# Patient Record
Sex: Female | Born: 1970 | Race: Black or African American | Hispanic: No | Marital: Single | Smoking: Never smoker
Health system: Southern US, Community
[De-identification: ages and names within clinical notes are randomized; demographics above are authoritative.]

## PROBLEM LIST (undated history)

## (undated) DIAGNOSIS — R112 Nausea with vomiting, unspecified: Secondary | ICD-10-CM

## (undated) DIAGNOSIS — T8859XA Other complications of anesthesia, initial encounter: Secondary | ICD-10-CM

## (undated) DIAGNOSIS — D509 Iron deficiency anemia, unspecified: Secondary | ICD-10-CM

## (undated) DIAGNOSIS — D649 Anemia, unspecified: Secondary | ICD-10-CM

## (undated) HISTORY — DX: Anemia, unspecified: D64.9

## (undated) HISTORY — DX: Iron deficiency anemia, unspecified: D50.9

## (undated) HISTORY — PX: SKIN BIOPSY: SHX1

---

## 1997-12-27 ENCOUNTER — Emergency Department (HOSPITAL_COMMUNITY): Admission: EM | Admit: 1997-12-27 | Discharge: 1997-12-27 | Payer: Self-pay | Admitting: Emergency Medicine

## 1997-12-27 ENCOUNTER — Encounter: Payer: Self-pay | Admitting: Emergency Medicine

## 2000-08-04 ENCOUNTER — Encounter: Admission: RE | Admit: 2000-08-04 | Discharge: 2000-10-25 | Payer: Self-pay | Admitting: Internal Medicine

## 2000-11-20 ENCOUNTER — Emergency Department (HOSPITAL_COMMUNITY): Admission: EM | Admit: 2000-11-20 | Discharge: 2000-11-20 | Payer: Self-pay

## 2003-12-10 ENCOUNTER — Ambulatory Visit (HOSPITAL_COMMUNITY): Admission: RE | Admit: 2003-12-10 | Discharge: 2003-12-10 | Payer: Self-pay | Admitting: *Deleted

## 2004-01-22 ENCOUNTER — Encounter (INDEPENDENT_AMBULATORY_CARE_PROVIDER_SITE_OTHER): Payer: Self-pay | Admitting: Specialist

## 2004-01-22 ENCOUNTER — Ambulatory Visit (HOSPITAL_COMMUNITY): Admission: RE | Admit: 2004-01-22 | Discharge: 2004-01-22 | Payer: Self-pay | Admitting: *Deleted

## 2004-09-02 ENCOUNTER — Other Ambulatory Visit: Admission: RE | Admit: 2004-09-02 | Discharge: 2004-09-02 | Payer: Self-pay | Admitting: Family Medicine

## 2008-05-22 ENCOUNTER — Other Ambulatory Visit: Admission: RE | Admit: 2008-05-22 | Discharge: 2008-05-22 | Payer: Self-pay | Admitting: Family Medicine

## 2015-10-21 ENCOUNTER — Encounter: Payer: Self-pay | Admitting: Hematology and Oncology

## 2015-10-21 ENCOUNTER — Telehealth: Payer: Self-pay | Admitting: Hematology and Oncology

## 2015-10-21 NOTE — Telephone Encounter (Signed)
Appointment scheduled with Alvy Bimler on 8/11 at 11:15am. Patient preferred Friday appointments. I asked the patient if she would like to be seen at the [facility] location, patient states that she's ok with the [facility] location. Demographics verified. Letter to referring and patient.

## 2015-11-06 ENCOUNTER — Ambulatory Visit: Payer: Self-pay | Admitting: Hematology and Oncology

## 2015-11-13 ENCOUNTER — Encounter: Payer: Self-pay | Admitting: Hematology and Oncology

## 2015-11-13 ENCOUNTER — Telehealth: Payer: Self-pay | Admitting: Hematology and Oncology

## 2015-11-13 ENCOUNTER — Ambulatory Visit (HOSPITAL_BASED_OUTPATIENT_CLINIC_OR_DEPARTMENT_OTHER): Payer: BC Managed Care – PPO

## 2015-11-13 ENCOUNTER — Ambulatory Visit (HOSPITAL_BASED_OUTPATIENT_CLINIC_OR_DEPARTMENT_OTHER): Payer: BC Managed Care – PPO | Admitting: Hematology and Oncology

## 2015-11-13 ENCOUNTER — Other Ambulatory Visit: Payer: Self-pay | Admitting: Hematology and Oncology

## 2015-11-13 ENCOUNTER — Ambulatory Visit (HOSPITAL_COMMUNITY)
Admission: RE | Admit: 2015-11-13 | Discharge: 2015-11-13 | Disposition: A | Discharge status: Home / Self Care | Payer: BC Managed Care – PPO | Source: Ambulatory Visit | Attending: Hematology and Oncology | Admitting: Hematology and Oncology

## 2015-11-13 DIAGNOSIS — D509 Iron deficiency anemia, unspecified: Secondary | ICD-10-CM

## 2015-11-13 HISTORY — DX: Iron deficiency anemia, unspecified: D50.9

## 2015-11-13 LAB — CBC & DIFF AND RETIC
BASO%: 1.8 % (ref 0.0–2.0)
BASOS ABS: 0.1 10*3/uL (ref 0.0–0.1)
EOS ABS: 0.2 10*3/uL (ref 0.0–0.5)
EOS%: 3 % (ref 0.0–7.0)
HEMATOCRIT: 24.1 % — AB (ref 34.8–46.6)
HGB: 7.3 g/dL — ABNORMAL LOW (ref 11.6–15.9)
IMMATURE RETIC FRACT: 24.4 % — AB (ref 1.60–10.00)
LYMPH#: 1.3 10*3/uL (ref 0.9–3.3)
LYMPH%: 19.9 % (ref 14.0–49.7)
MCH: 20.6 pg — ABNORMAL LOW (ref 25.1–34.0)
MCHC: 30.3 g/dL — AB (ref 31.5–36.0)
MCV: 68.1 fL — ABNORMAL LOW (ref 79.5–101.0)
MONO#: 0.5 10*3/uL (ref 0.1–0.9)
MONO%: 7 % (ref 0.0–14.0)
NEUT#: 4.5 10*3/uL (ref 1.5–6.5)
NEUT%: 68.3 % (ref 38.4–76.8)
Platelets: 330 10*3/uL (ref 145–400)
RBC: 3.54 10*6/uL — AB (ref 3.70–5.45)
RDW: 18.5 % — ABNORMAL HIGH (ref 11.2–14.5)
RETIC %: 1.34 % (ref 0.70–2.10)
RETIC CT ABS: 47.44 10*3/uL (ref 33.70–90.70)
WBC: 6.6 10*3/uL (ref 3.9–10.3)
nRBC: 0 % (ref 0–0)

## 2015-11-13 LAB — TECHNOLOGIST REVIEW

## 2015-11-13 LAB — PREPARE RBC (CROSSMATCH)

## 2015-11-13 LAB — ABO/RH: ABO/RH(D): O POS

## 2015-11-13 LAB — FERRITIN: FERRITIN: 4 ng/mL — AB (ref 9–269)

## 2015-11-13 MED ORDER — ACETAMINOPHEN 325 MG PO TABS
650.0000 mg | ORAL_TABLET | Freq: Once | ORAL | Status: AC
  Administered 2015-11-13: 650 mg via ORAL

## 2015-11-13 MED ORDER — ACETAMINOPHEN 325 MG PO TABS
ORAL_TABLET | ORAL | Status: AC
  Filled 2015-11-13: qty 2

## 2015-11-13 MED ORDER — SODIUM CHLORIDE 0.9 % IV SOLN
250.0000 mL | Freq: Once | INTRAVENOUS | Status: AC
  Administered 2015-11-13: 250 mL via INTRAVENOUS

## 2015-11-13 NOTE — Progress Notes (Signed)
[place] CONSULT NOTE  Patient Care Team: [name], MD as PCP - General (Family Medicine)  CHIEF COMPLAINTS/PURPOSE OF CONSULTATION:  Severe iron deficiency anemia  HISTORY OF PRESENTING ILLNESS:  Terri Kirby 45 y.o. female is here because of severe anemia  She was found to have abnormal CBC from routine blood work through her primary care doctor's office. Her blood count in July showed hemoglobin of 8.3 with low MCV. She denies recent chest pain on exertion, shortness of breath on minimal exertion, pre-syncopal episodes, or palpitations. She complained of profound fatigue and leg cramps She had not noticed any recent bleeding such as epistaxis, hematuria or hematochezia The patient denies over the counter NSAID ingestion. She is not on antiplatelets agents.  She had no prior history or diagnosis of cancer. Her age appropriate screening programs are up-to-date. She has pica with craving for paper and excessive chewing of ice. The patient has been a vegetarian since 1995 She never donated blood or received blood transfusion The patient was prescribed oral iron supplements and she takes daily but it causes significant constipation The patient has history of menorrhagia  MEDICAL HISTORY:  Past Medical History:  Diagnosis Date  . Anemia   . Iron deficiency anemia 11/13/2015    SURGICAL HISTORY: Past Surgical History:  Procedure Laterality Date  . SKIN BIOPSY      SOCIAL HISTORY: Social History   Social History  . Marital status: Single    Spouse name: N/A  . Number of children: 0  . Years of education: N/A   Occupational History  . administrator [employer] History Main Topics  . Smoking status: Never Smoker  . Smokeless tobacco: Never Used  . Alcohol use Yes     Comment: socially  . Drug use: Unknown  . Sexual activity: Not on file   Other Topics Concern  . Not on file   Social History Narrative   Vegetarian since  1995    FAMILY HISTORY: History reviewed. No pertinent family history.  ALLERGIES:  has No Known Allergies.  MEDICATIONS:  Current Outpatient Prescriptions  Medication Sig Dispense Refill  . albuterol (PROVENTIL HFA;VENTOLIN HFA) 108 (90 Base) MCG/ACT inhaler Inhale 1 puff into the lungs every 6 (six) hours as needed for wheezing or shortness of breath.    . doxepin (SINEQUAN) 10 MG capsule Take 10 mg by mouth as needed (itching).    . norethindrone (MICRONOR,CAMILA,ERRIN) 0.35 MG tablet Take 1 tablet by mouth daily.     No current facility-administered medications for this visit.     REVIEW OF SYSTEMS:   Constitutional: Denies fevers, chills or abnormal night sweats Eyes: Denies blurriness of vision, double vision or watery eyes Ears, nose, mouth, throat, and face: Denies mucositis or sore throat Respiratory: Denies cough, dyspnea or wheezes Cardiovascular: Denies palpitation, chest discomfort or lower extremity swelling Gastrointestinal:  Denies nausea, heartburn or change in bowel habits Skin: Denies abnormal skin rashes Lymphatics: Denies new lymphadenopathy or easy bruising Neurological:Denies numbness, tingling or new weaknesses Behavioral/Psych: Mood is stable, no new changes  All other systems were reviewed with the patient and are negative.  PHYSICAL EXAMINATION: ECOG PERFORMANCE STATUS: 1 - Symptomatic but completely ambulatory  Vitals:   11/13/15 1121  BP: 136/70  Pulse: (!) 117  Resp: 18  Temp: 98.3 F (36.8 C)   Filed Weights   11/13/15 1121  Weight: 157 lb 3.2 oz (71.3 kg)   Lab Results  Component Value Date  WBC 6.6 11/13/2015   HGB 7.3 (L) 11/13/2015   HCT 24.1 (L) 11/13/2015   MCV 68.1 (L) 11/13/2015   PLT 330 11/13/2015    GENERAL:alert, no distress and comfortable SKIN: skin color, texture, turgor are normal, no rashes or significant lesions EYES: normal, conjunctiva are pale and non-injected, sclera clear OROPHARYNX:no exudate, no  erythema and lips, buccal mucosa, and tongue normal  NECK: supple, thyroid normal size, non-tender, without nodularity LYMPH:  no palpable lymphadenopathy in the cervical, axillary or inguinal LUNGS: clear to auscultation and percussion with normal breathing effort HEART: tachycardia without murmurs and no lower extremity edema ABDOMEN:abdomen soft, non-tender and normal bowel sounds Musculoskeletal:no cyanosis of digits and no clubbing  PSYCH: alert & oriented x 3 with fluent speech NEURO: no focal motor/sensory deficits  ASSESSMENT & PLAN:  Iron deficiency anemia We discussed some of the risks, benefits, and alternatives of blood transfusions. The patient is symptomatic from anemia and the hemoglobin level is critically low.  Some of the side-effects to be expected including risks of transfusion reactions, chills, infection, syndrome of volume overload and risk of hospitalization from various reasons and the patient is willing to proceed and went ahead to sign consent today. I will give her 1 unit of blood today. I will give her 2 doses of IV iron over the next 2 weeks and recheck blood work in October. There is a chance that she may need further iron therapy in October. The most likely cause of her anemia is due to chronic blood loss/malabsorption syndrome. We discussed some of the risks, benefits, and alternatives of intravenous iron infusions. The patient is symptomatic from anemia and the iron level is critically low. She tolerated oral iron supplement poorly and desires to achieved higher levels of iron faster for adequate hematopoesis. Some of the side-effects to be expected including risks of infusion reactions, phlebitis, headaches, nausea and fatigue.  The patient is willing to proceed. Patient education material was dispensed.  Goal is to keep ferritin level greater than 50   Orders Placed This Encounter  Procedures  . CBC & Diff and Retic    Standing Status:   Standing    Number  of Occurrences:   9    Standing Expiration Date:   11/12/2016  . Ferritin    Standing Status:   Standing    Number of Occurrences:   9    Standing Expiration Date:   11/12/2016  . Hold Tube, Blood Bank    Standing Status:   Future    Number of Occurrences:   1    Standing Expiration Date:   12/17/2016      All questions were answered. The patient knows to call the clinic with any problems, questions or concerns. I spent 40 minutes counseling the patient face to face. The total time spent in the appointment was 55 minutes and more than 50% was on counseling.     [place], [place], MD 11/13/15 12:52 PM

## 2015-11-13 NOTE — Progress Notes (Signed)
Pt tolerated blood transfusion well. Pt and VS stable at time of discharge. Pt educated to call clinic with any questions or concerns. Pt verbalized understanding.

## 2015-11-13 NOTE — Assessment & Plan Note (Signed)
We discussed some of the risks, benefits, and alternatives of blood transfusions. The patient is symptomatic from anemia and the hemoglobin level is critically low.  Some of the side-effects to be expected including risks of transfusion reactions, chills, infection, syndrome of volume overload and risk of hospitalization from various reasons and the patient is willing to proceed and went ahead to sign consent today. I will give her 1 unit of blood today. I will give her 2 doses of IV iron over the next 2 weeks and recheck blood work in October. There is a chance that she may need further iron therapy in October. The most likely cause of her anemia is due to chronic blood loss/malabsorption syndrome. We discussed some of the risks, benefits, and alternatives of intravenous iron infusions. The patient is symptomatic from anemia and the iron level is critically low. She tolerated oral iron supplement poorly and desires to achieved higher levels of iron faster for adequate hematopoesis. Some of the side-effects to be expected including risks of infusion reactions, phlebitis, headaches, nausea and fatigue.  The patient is willing to proceed. Patient education material was dispensed.  Goal is to keep ferritin level greater than 50

## 2015-11-13 NOTE — Patient Instructions (Signed)

## 2015-11-13 NOTE — Telephone Encounter (Signed)
Contacted Dr. Ammie Ferrier office to make aware another md has already referred pt for a hem appt with same dx.; gave appt date/time.

## 2015-11-13 NOTE — Telephone Encounter (Signed)
Gave pt cal & avs °

## 2015-11-16 LAB — TYPE AND SCREEN
ABO/RH(D): O POS
Antibody Screen: NEGATIVE
UNIT DIVISION: 0

## 2015-11-20 ENCOUNTER — Ambulatory Visit (HOSPITAL_BASED_OUTPATIENT_CLINIC_OR_DEPARTMENT_OTHER): Payer: BC Managed Care – PPO

## 2015-11-20 VITALS — BP 121/66 | HR 87 | Temp 98.0°F | Resp 18

## 2015-11-20 DIAGNOSIS — D509 Iron deficiency anemia, unspecified: Secondary | ICD-10-CM

## 2015-11-20 MED ORDER — SODIUM CHLORIDE 0.9 % IV SOLN
Freq: Once | INTRAVENOUS | Status: AC
Start: 2015-11-20 — End: 2015-11-20
  Administered 2015-11-20: 09:00:00 via INTRAVENOUS

## 2015-11-20 MED ORDER — SODIUM CHLORIDE 0.9 % IV SOLN
510.0000 mg | Freq: Once | INTRAVENOUS | Status: AC
  Administered 2015-11-20: 510 mg via INTRAVENOUS
  Filled 2015-11-20: qty 17

## 2015-11-20 NOTE — Patient Instructions (Signed)

## 2015-11-27 ENCOUNTER — Ambulatory Visit (HOSPITAL_BASED_OUTPATIENT_CLINIC_OR_DEPARTMENT_OTHER): Payer: BC Managed Care – PPO

## 2015-11-27 VITALS — BP 125/61 | HR 84 | Temp 98.6°F | Resp 20

## 2015-11-27 DIAGNOSIS — D509 Iron deficiency anemia, unspecified: Secondary | ICD-10-CM | POA: Diagnosis not present

## 2015-11-27 MED ORDER — SODIUM CHLORIDE 0.9 % IV SOLN
Freq: Once | INTRAVENOUS | Status: AC
Start: 2015-11-27 — End: 2015-11-27
  Administered 2015-11-27: 15:00:00 via INTRAVENOUS

## 2015-11-27 MED ORDER — SODIUM CHLORIDE 0.9 % IV SOLN
510.0000 mg | Freq: Once | INTRAVENOUS | Status: AC
  Administered 2015-11-27: 510 mg via INTRAVENOUS
  Filled 2015-11-27: qty 17

## 2015-11-27 NOTE — Patient Instructions (Signed)
Iron Deficiency Anemia, Adult Anemia is a condition in which there are less red blood cells or hemoglobin in the blood than normal. Hemoglobin is the part of red blood cells that carries oxygen. Iron deficiency anemia is anemia caused by too little iron. It is the most common type of anemia. It may leave you tired and short of breath. CAUSES   Lack of iron in the diet.  Poor absorption of iron, as seen with intestinal disorders.  Intestinal bleeding.  Heavy periods. SIGNS AND SYMPTOMS  Mild anemia may not be noticeable. Symptoms may include:  Fatigue.  Headache.  Pale skin.  Weakness.  Tiredness.  Shortness of breath.  Dizziness.  Cold hands and feet.  Fast or irregular heartbeat. DIAGNOSIS  Diagnosis requires a thorough evaluation and physical exam by your health care provider. Blood tests are generally used to confirm iron deficiency anemia. Additional tests may be done to find the underlying cause of your anemia. These may include:  Testing for blood in the stool (fecal occult blood test).  A procedure to see inside the colon and rectum (colonoscopy).  A procedure to see inside the esophagus and stomach (endoscopy). TREATMENT  Iron deficiency anemia is treated by correcting the cause of the deficiency. Treatment may involve:  Adding iron-rich foods to your diet.  Taking iron supplements. Pregnant or breastfeeding women need to take extra iron because their normal diet usually does not provide the required amount.  Taking vitamins. Vitamin C improves the absorption of iron. Your health care provider may recommend that you take your iron tablets with a glass of orange juice or vitamin C supplement.  Medicines to make heavy menstrual flow lighter.  Surgery. HOME CARE INSTRUCTIONS   Take iron as directed by your health care provider.  If you cannot tolerate taking iron supplements by mouth, talk to your health care provider about taking them through a vein  (intravenously) or an injection into a muscle.  For the best iron absorption, iron supplements should be taken on an empty stomach. If you cannot tolerate them on an empty stomach, you may need to take them with food.  Do not drink milk or take antacids at the same time as your iron supplements. Milk and antacids may interfere with the absorption of iron.  Iron supplements can cause constipation. Make sure to include fiber in your diet to prevent constipation. A stool softener may also be recommended.  Take vitamins as directed by your health care provider.  Eat a diet rich in iron. Foods high in iron include liver, lean beef, whole-grain bread, eggs, dried fruit, and dark green leafy vegetables. SEEK IMMEDIATE MEDICAL CARE IF:   You faint. If this happens, do not drive. Call your local emergency services (911 in [place] if no other help is available.  You have chest pain.  You feel nauseous or vomit.  You have severe or increased shortness of breath with activity.  You feel weak.  You have a rapid heartbeat.  You have unexplained sweating.  You become light-headed when getting up from a chair or bed. MAKE SURE YOU:   Understand these instructions.  Will watch your condition.  Will get help right away if you are not doing well or get worse.   This information is not intended to replace advice given to you by your health care provider. Make sure you discuss any questions you have with your health care provider.   Document Released: 03/11/2000 Document Revised: 04/04/2014 Document Reviewed: 11/19/2012 Elsevier   Interactive Patient Education 2016 Elsevier Inc.  

## 2015-12-24 ENCOUNTER — Telehealth: Payer: Self-pay | Admitting: Hematology and Oncology

## 2015-12-24 NOTE — Telephone Encounter (Signed)
10/06 Appointment time changed per patient request. Patient requested to change appointment time to late afternoon.

## 2016-01-01 ENCOUNTER — Other Ambulatory Visit (HOSPITAL_BASED_OUTPATIENT_CLINIC_OR_DEPARTMENT_OTHER): Payer: BC Managed Care – PPO

## 2016-01-01 DIAGNOSIS — D509 Iron deficiency anemia, unspecified: Secondary | ICD-10-CM | POA: Diagnosis not present

## 2016-01-01 LAB — CBC & DIFF AND RETIC
BASO%: 1.3 % (ref 0.0–2.0)
Basophils Absolute: 0.1 10*3/uL (ref 0.0–0.1)
EOS%: 2.7 % (ref 0.0–7.0)
Eosinophils Absolute: 0.2 10*3/uL (ref 0.0–0.5)
HEMATOCRIT: 36 % (ref 34.8–46.6)
HGB: 11.8 g/dL (ref 11.6–15.9)
Immature Retic Fract: 7.6 % (ref 1.60–10.00)
LYMPH#: 2 10*3/uL (ref 0.9–3.3)
LYMPH%: 32.8 % (ref 14.0–49.7)
MCH: 26.9 pg (ref 25.1–34.0)
MCHC: 32.8 g/dL (ref 31.5–36.0)
MCV: 82.2 fL (ref 79.5–101.0)
MONO#: 0.5 10*3/uL (ref 0.1–0.9)
MONO%: 8.4 % (ref 0.0–14.0)
NEUT%: 54.8 % (ref 38.4–76.8)
NEUTROS ABS: 3.3 10*3/uL (ref 1.5–6.5)
NRBC: 0 % (ref 0–0)
Platelets: 180 10*3/uL (ref 145–400)
RBC: 4.38 10*6/uL (ref 3.70–5.45)
RETIC %: 1.31 % (ref 0.70–2.10)
RETIC CT ABS: 57.38 10*3/uL (ref 33.70–90.70)
WBC: 6 10*3/uL (ref 3.9–10.3)

## 2016-01-01 LAB — FERRITIN: Ferritin: 88 ng/ml (ref 9–269)

## 2016-01-04 ENCOUNTER — Telehealth: Payer: Self-pay | Admitting: *Deleted

## 2016-01-04 ENCOUNTER — Other Ambulatory Visit: Payer: Self-pay | Admitting: Hematology and Oncology

## 2016-01-04 DIAGNOSIS — D508 Other iron deficiency anemias: Secondary | ICD-10-CM

## 2016-01-04 NOTE — Telephone Encounter (Signed)
Informed pt of Lab results and Dr. Calton Dach message.  Appts canceled for this Friday.  She verbalized understanding.  She would like to come back here to recheck labs in 3 months (her PCP is going out on Maternity leave soon).

## 2016-01-04 NOTE — Telephone Encounter (Signed)
-----   Message from Heath Lark, MD sent at 01/01/2016  4:32 PM EDT ----- Regarding: cancel appt next week  She is not anemic and iron studies are good You can call her and cancel appt on Friday I recommend recheck labs either here or PCP in 3 months If she wants labs here please let me know and I will place order ----- Message ----- From: Interface, Lab In Three Zero One Sent: 01/01/2016   1:41 PM To: Heath Lark, MD

## 2016-01-04 NOTE — Telephone Encounter (Signed)
I placed orders and scheduling message for Jan lab appt only We will call her once we have results

## 2016-01-06 ENCOUNTER — Telehealth: Payer: Self-pay | Admitting: Hematology and Oncology

## 2016-01-06 NOTE — Telephone Encounter (Signed)
Spoke with pt to confirm 04/01/16 appt per LOS

## 2016-01-08 ENCOUNTER — Ambulatory Visit: Payer: BC Managed Care – PPO | Admitting: Hematology and Oncology

## 2016-01-08 ENCOUNTER — Ambulatory Visit: Payer: BC Managed Care – PPO

## 2016-01-21 ENCOUNTER — Other Ambulatory Visit: Payer: Self-pay | Admitting: Family Medicine

## 2016-01-21 ENCOUNTER — Other Ambulatory Visit (HOSPITAL_COMMUNITY)
Admission: RE | Admit: 2016-01-21 | Discharge: 2016-01-21 | Disposition: A | Discharge status: Home / Self Care | Payer: BC Managed Care – PPO | Source: Ambulatory Visit | Attending: Family Medicine | Admitting: Family Medicine

## 2016-01-21 DIAGNOSIS — N921 Excessive and frequent menstruation with irregular cycle: Secondary | ICD-10-CM

## 2016-01-21 DIAGNOSIS — Z113 Encounter for screening for infections with a predominantly sexual mode of transmission: Secondary | ICD-10-CM | POA: Insufficient documentation

## 2016-01-21 DIAGNOSIS — Z124 Encounter for screening for malignant neoplasm of cervix: Secondary | ICD-10-CM | POA: Insufficient documentation

## 2016-01-21 DIAGNOSIS — Z1151 Encounter for screening for human papillomavirus (HPV): Secondary | ICD-10-CM | POA: Insufficient documentation

## 2016-01-26 LAB — CYTOLOGY - PAP
CHLAMYDIA, DNA PROBE: NEGATIVE
Diagnosis: NEGATIVE
HPV (WINDOPATH): NOT DETECTED
NEISSERIA GONORRHEA: NEGATIVE

## 2016-01-29 ENCOUNTER — Telehealth: Payer: Self-pay | Admitting: *Deleted

## 2016-01-29 NOTE — Telephone Encounter (Signed)
Notified of results OK. Will keep appt with NG

## 2016-02-22 ENCOUNTER — Other Ambulatory Visit: Payer: BC Managed Care – PPO

## 2016-03-03 ENCOUNTER — Other Ambulatory Visit: Payer: BC Managed Care – PPO

## 2016-04-01 ENCOUNTER — Telehealth: Payer: Self-pay | Admitting: *Deleted

## 2016-04-01 ENCOUNTER — Other Ambulatory Visit (HOSPITAL_BASED_OUTPATIENT_CLINIC_OR_DEPARTMENT_OTHER): Payer: BLUE CROSS/BLUE SHIELD

## 2016-04-01 DIAGNOSIS — D508 Other iron deficiency anemias: Secondary | ICD-10-CM

## 2016-04-01 DIAGNOSIS — D509 Iron deficiency anemia, unspecified: Secondary | ICD-10-CM

## 2016-04-01 LAB — CBC & DIFF AND RETIC
BASO%: 1 % (ref 0.0–2.0)
BASOS ABS: 0.1 10*3/uL (ref 0.0–0.1)
EOS ABS: 0.3 10*3/uL (ref 0.0–0.5)
EOS%: 5.6 % (ref 0.0–7.0)
HEMATOCRIT: 35.3 % (ref 34.8–46.6)
HEMOGLOBIN: 11.9 g/dL (ref 11.6–15.9)
IMMATURE RETIC FRACT: 7.4 % (ref 1.60–10.00)
LYMPH#: 1.6 10*3/uL (ref 0.9–3.3)
LYMPH%: 32.9 % (ref 14.0–49.7)
MCH: 29.7 pg (ref 25.1–34.0)
MCHC: 33.7 g/dL (ref 31.5–36.0)
MCV: 88 fL (ref 79.5–101.0)
MONO#: 0.3 10*3/uL (ref 0.1–0.9)
MONO%: 5.4 % (ref 0.0–14.0)
NEUT#: 2.8 10*3/uL (ref 1.5–6.5)
NEUT%: 55.1 % (ref 38.4–76.8)
PLATELETS: 197 10*3/uL (ref 145–400)
RBC: 4.01 10*6/uL (ref 3.70–5.45)
RDW: 14.7 % — AB (ref 11.2–14.5)
RETIC %: 1.53 % (ref 0.70–2.10)
RETIC CT ABS: 61.35 10*3/uL (ref 33.70–90.70)
WBC: 5 10*3/uL (ref 3.9–10.3)

## 2016-04-01 LAB — FERRITIN: Ferritin: 12 ng/ml (ref 9–269)

## 2016-04-01 NOTE — Telephone Encounter (Signed)
-----   Message from Heath Lark, MD sent at 04/01/2016  3:39 PM EST ----- Regarding: labs She is not anemic Ferritin level is a bit low I recommend OTC iron daily at bed time Can she gets repeat labs with PCP in 3 months or does she want to come back here? If she wants to repeat labs here, please place scheduling appt and I will place orders ----- Message ----- From: Interface, Lab In Three Zero One Sent: 04/01/2016   2:13 PM To: Heath Lark, MD

## 2016-04-01 NOTE — Telephone Encounter (Signed)
"  I missed a cal from th is number"    1. Lab results provided.   2. Instructed to try OTC Iron daily. 3. Needs lab in three months.  Offered lab at [facility] or with PCP.  "I'll go to PCP." Asked that she call in future if needed.  And reviewed side effects of iron to take with Orange juice at night.  No further questions.

## 2016-04-01 NOTE — Telephone Encounter (Signed)
Attempted to call with Dr Alvy Bimler message . Mailbox full.

## 2016-04-04 ENCOUNTER — Telehealth: Payer: Self-pay | Admitting: *Deleted

## 2016-04-04 NOTE — Telephone Encounter (Signed)
-----   Message from Patton Salles, RN sent at 04/01/2016  4:16 PM EST ----- Regarding: FW: labs Please call. Mailbox was full... ----- Message ----- From: Heath Lark, MD Sent: 04/01/2016   3:39 PM To: Patton Salles, RN Subject: labs                                           She is not anemic Ferritin level is a bit low I recommend OTC iron daily at bed time Can she gets repeat labs with PCP in 3 months or does she want to come back here? If she wants to repeat labs here, please place scheduling appt and I will place orders ----- Message ----- From: Interface, Lab In Three Zero One Sent: 04/01/2016   2:13 PM To: Heath Lark, MD

## 2016-04-04 NOTE — Telephone Encounter (Signed)
Opened phone note to call pt regarding results and Dr. Calton Dach message.  Saw that pt already returned call and s/w Triage RN.  See note from triage RN on 1/5.

## 2016-04-06 ENCOUNTER — Telehealth: Payer: Self-pay | Admitting: *Deleted

## 2016-04-06 NOTE — Telephone Encounter (Signed)
error 

## 2016-08-24 ENCOUNTER — Other Ambulatory Visit: Payer: Self-pay | Admitting: Family Medicine

## 2016-08-24 DIAGNOSIS — N920 Excessive and frequent menstruation with regular cycle: Secondary | ICD-10-CM

## 2016-09-14 ENCOUNTER — Ambulatory Visit
Admission: RE | Admit: 2016-09-14 | Discharge: 2016-09-14 | Disposition: A | Discharge status: Home / Self Care | Payer: BLUE CROSS/BLUE SHIELD | Source: Ambulatory Visit | Attending: Family Medicine | Admitting: Family Medicine

## 2016-09-14 DIAGNOSIS — N920 Excessive and frequent menstruation with regular cycle: Secondary | ICD-10-CM

## 2019-07-24 ENCOUNTER — Other Ambulatory Visit: Payer: Self-pay | Admitting: Family Medicine

## 2019-07-24 DIAGNOSIS — N6452 Nipple discharge: Secondary | ICD-10-CM

## 2019-08-01 ENCOUNTER — Telehealth: Payer: Self-pay | Admitting: Hematology and Oncology

## 2019-08-01 NOTE — Telephone Encounter (Signed)
Received a new hem referral from Marda Stalker at [facility] for [name]. Terri Kirby has been cld and shceduled to see Dr. Alvy Bimler on 5/24 at 1:20pm. Pt aware to arrive 15 minutes early. I offered an earlier appt date but pt was unable to come at the times offered.

## 2019-08-13 ENCOUNTER — Ambulatory Visit
Admission: RE | Admit: 2019-08-13 | Discharge: 2019-08-13 | Disposition: A | Discharge status: Home / Self Care | Payer: BLUE CROSS/BLUE SHIELD | Source: Ambulatory Visit | Attending: Family Medicine | Admitting: Family Medicine

## 2019-08-13 ENCOUNTER — Other Ambulatory Visit: Payer: Self-pay

## 2019-08-13 ENCOUNTER — Other Ambulatory Visit: Payer: Self-pay | Admitting: Family Medicine

## 2019-08-13 DIAGNOSIS — N6452 Nipple discharge: Secondary | ICD-10-CM

## 2019-08-14 ENCOUNTER — Ambulatory Visit
Admission: RE | Admit: 2019-08-14 | Discharge: 2019-08-14 | Disposition: A | Discharge status: Home / Self Care | Payer: BC Managed Care – PPO | Source: Ambulatory Visit | Attending: Family Medicine | Admitting: Family Medicine

## 2019-08-14 ENCOUNTER — Other Ambulatory Visit: Payer: Self-pay

## 2019-08-14 DIAGNOSIS — N6452 Nipple discharge: Secondary | ICD-10-CM

## 2019-08-19 ENCOUNTER — Telehealth: Payer: Self-pay | Admitting: Hematology and Oncology

## 2019-08-19 ENCOUNTER — Other Ambulatory Visit: Payer: Self-pay | Admitting: Hematology and Oncology

## 2019-08-19 ENCOUNTER — Encounter: Payer: Self-pay | Admitting: Hematology and Oncology

## 2019-08-19 ENCOUNTER — Inpatient Hospital Stay: Payer: BC Managed Care – PPO | Admitting: Hematology and Oncology

## 2019-08-19 ENCOUNTER — Inpatient Hospital Stay: Payer: BC Managed Care – PPO

## 2019-08-19 ENCOUNTER — Other Ambulatory Visit: Payer: Self-pay

## 2019-08-19 ENCOUNTER — Inpatient Hospital Stay: Payer: BC Managed Care – PPO | Attending: Hematology and Oncology

## 2019-08-19 VITALS — BP 138/81 | HR 115 | Temp 98.6°F | Resp 18 | Ht 67.0 in | Wt 207.8 lb

## 2019-08-19 DIAGNOSIS — N920 Excessive and frequent menstruation with regular cycle: Secondary | ICD-10-CM | POA: Insufficient documentation

## 2019-08-19 DIAGNOSIS — D500 Iron deficiency anemia secondary to blood loss (chronic): Secondary | ICD-10-CM | POA: Insufficient documentation

## 2019-08-19 DIAGNOSIS — Z86018 Personal history of other benign neoplasm: Secondary | ICD-10-CM | POA: Insufficient documentation

## 2019-08-19 DIAGNOSIS — D539 Nutritional anemia, unspecified: Secondary | ICD-10-CM | POA: Insufficient documentation

## 2019-08-19 DIAGNOSIS — D508 Other iron deficiency anemias: Secondary | ICD-10-CM

## 2019-08-19 DIAGNOSIS — D241 Benign neoplasm of right breast: Secondary | ICD-10-CM

## 2019-08-19 LAB — CBC WITH DIFFERENTIAL/PLATELET
Abs Immature Granulocytes: 0.03 10*3/uL (ref 0.00–0.07)
Basophils Absolute: 0.1 10*3/uL (ref 0.0–0.1)
Basophils Relative: 1 %
Eosinophils Absolute: 0.1 10*3/uL (ref 0.0–0.5)
Eosinophils Relative: 1 %
HCT: 24.6 % — ABNORMAL LOW (ref 36.0–46.0)
Hemoglobin: 6.8 g/dL — CL (ref 12.0–15.0)
Immature Granulocytes: 0 %
Lymphocytes Relative: 20 %
Lymphs Abs: 1.6 10*3/uL (ref 0.7–4.0)
MCH: 18.5 pg — ABNORMAL LOW (ref 26.0–34.0)
MCHC: 27.6 g/dL — ABNORMAL LOW (ref 30.0–36.0)
MCV: 67 fL — ABNORMAL LOW (ref 80.0–100.0)
Monocytes Absolute: 0.6 10*3/uL (ref 0.1–1.0)
Monocytes Relative: 7 %
Neutro Abs: 5.8 10*3/uL (ref 1.7–7.7)
Neutrophils Relative %: 71 %
Platelets: 484 10*3/uL — ABNORMAL HIGH (ref 150–400)
RBC: 3.67 MIL/uL — ABNORMAL LOW (ref 3.87–5.11)
RDW: 19.6 % — ABNORMAL HIGH (ref 11.5–15.5)
WBC: 8.2 10*3/uL (ref 4.0–10.5)
nRBC: 0 % (ref 0.0–0.2)

## 2019-08-19 LAB — IRON AND TIBC
Iron: 10 ug/dL — ABNORMAL LOW (ref 41–142)
Saturation Ratios: 2 % — ABNORMAL LOW (ref 21–57)
TIBC: 463 ug/dL — ABNORMAL HIGH (ref 236–444)
UIBC: 453 ug/dL — ABNORMAL HIGH (ref 120–384)

## 2019-08-19 LAB — PREPARE RBC (CROSSMATCH)

## 2019-08-19 LAB — VITAMIN B12: Vitamin B-12: 263 pg/mL (ref 180–914)

## 2019-08-19 LAB — FERRITIN: Ferritin: <4 ng/mL — ABNORMAL LOW (ref 11–307)

## 2019-08-19 LAB — SEDIMENTATION RATE: Sed Rate: 32 mm/hr — ABNORMAL HIGH (ref 0–22)

## 2019-08-19 LAB — SAMPLE TO BLOOD BANK

## 2019-08-19 LAB — ABO/RH: ABO/RH(D): O POS

## 2019-08-19 MED ORDER — DIPHENHYDRAMINE HCL 25 MG PO CAPS
25.0000 mg | ORAL_CAPSULE | Freq: Once | ORAL | Status: AC
  Administered 2019-08-19: 25 mg via ORAL

## 2019-08-19 MED ORDER — DIPHENHYDRAMINE HCL 25 MG PO CAPS
ORAL_CAPSULE | ORAL | Status: AC
  Filled 2019-08-19: qty 1

## 2019-08-19 MED ORDER — SODIUM CHLORIDE 0.9% IV SOLUTION
250.0000 mL | Freq: Once | INTRAVENOUS | Status: DC
  Filled 2019-08-19: qty 250

## 2019-08-19 MED ORDER — ACETAMINOPHEN 325 MG PO TABS
650.0000 mg | ORAL_TABLET | Freq: Once | ORAL | Status: AC
  Administered 2019-08-19: 650 mg via ORAL

## 2019-08-19 MED ORDER — ACETAMINOPHEN 325 MG PO TABS
ORAL_TABLET | ORAL | Status: AC
  Filled 2019-08-19: qty 2

## 2019-08-19 NOTE — Assessment & Plan Note (Signed)
She is referred for lumpectomy/general surgery assessment due to recent findings of papilloma of the breast

## 2019-08-19 NOTE — Assessment & Plan Note (Signed)
She has appointment to follow-up with GYN for management of menorrhagia If she continues to have unexplained blood loss despite treatment for menorrhagia, we will have to refer her to GI for evaluation

## 2019-08-19 NOTE — Assessment & Plan Note (Signed)
She is profoundly symptomatic from anemia Her hemoglobin is very low I recommend blood transfusion first before we proceed with intravenous iron infusion and she agreed We discussed some of the risks, benefits, and alternatives of blood transfusions. The patient is symptomatic from anemia and the hemoglobin level is critically low.  Some of the side-effects to be expected including risks of transfusion reactions, chills, infection, syndrome of volume overload and risk of hospitalization from various reasons and the patient is willing to proceed and went ahead to sign consent today. I will transfuse her with 1 unit of blood today  The most likely cause of her anemia is due to chronic blood loss/malabsorption syndrome/vegetarian diet We discussed some of the risks, benefits, and alternatives of intravenous iron infusions. The patient is symptomatic from anemia and the iron level is critically low. She tolerated oral iron supplement poorly and desires to achieved higher levels of iron faster for adequate hematopoesis. Some of the side-effects to be expected including risks of infusion reactions, phlebitis, headaches, nausea and fatigue.  The patient is willing to proceed. Patient education material was dispensed.  Goal is to keep ferritin level greater than 50 and resolution of anemia She will start intravenous iron infusion in June After 2 doses, I will bring her back in recheck her blood again next month for further follow-up She might need further iron infusion

## 2019-08-19 NOTE — Progress Notes (Signed)
Reported critical HGB of 6.8 to Dr. Gorsuch. 

## 2019-08-19 NOTE — Telephone Encounter (Signed)
Scheduled per 5/24 sch message. Messaged Product/process development scientist to give pt updated calendar.

## 2019-08-19 NOTE — Patient Instructions (Signed)

## 2019-08-19 NOTE — Progress Notes (Signed)
[place] progress notes  Patient Care Team: [name], MD as PCP - General (Family Medicine)  CHIEF COMPLAINTS/PURPOSE OF VISIT:  Recurrent, severe iron deficiency anemia  HISTORY OF PRESENTING ILLNESS:  Terri Kirby 49 y.o. female was seen back in my office for further evaluation and management of severe iron deficiency anemia She was last seen in the office in 2017 Her office visit today is considered a new patient visit as more than 3 years have elapsed since she was last seen.  Summary of her history is as follows: She was found to have abnormal CBC from routine blood work through her primary care doctor's office.  She has pica with craving for paper and excessive chewing of ice. The patient has been a vegetarian since 1995  The patient was prescribed oral iron supplements and she takes daily but it causes significant constipation The patient has history of menorrhagia She has received blood transfusion and intravenous iron infusion in 2017 Since then, she was lost to follow-up Recently, she started to complain of excessive fatigue, bilateral leg swelling, dizziness and lightheadedness She denies chest pain but does have chronic shortness of breath from asthma She has heavy menstruation, her typical menstrual cycle is 7 to 8 days out of her cycle of every 28 days She have passage of large amount of clots The patient denies any recent signs or symptoms of bleeding such as spontaneous epistaxis, hematuria or hematochezia. She continues on her vegetarian diet She has excessive pica with chewing on ice She was told that she have uterine fibroid and is in the process of seeing gynecologist for further management of menorrhagia  She had recent abnormal mammogram with biopsy that show breast papilloma She is in the process of being referred to see general surgery  MEDICAL HISTORY:  Past Medical History:  Diagnosis Date  . Anemia   . Iron deficiency  anemia 11/13/2015    SURGICAL HISTORY: Past Surgical History:  Procedure Laterality Date  . SKIN BIOPSY      SOCIAL HISTORY: Social History   Socioeconomic History  . Marital status: Single    Spouse name: Not on file  . Number of children: 0  . Years of education: Not on file  . Highest education level: Not on file  Occupational History  . Occupation: Technical brewer: [employer]  Tobacco Use  . Smoking status: Never Smoker  . Smokeless tobacco: Never Used  Substance and Sexual Activity  . Alcohol use: Yes    Comment: socially  . Drug use: Not on file  . Sexual activity: Not on file  Other Topics Concern  . Not on file  Social History Narrative   Vegetarian since 15   Social Determinants of Health   Financial Resource Strain:   . Difficulty of Paying Living Expenses:   Food Insecurity:   . Worried About Charity fundraiser in the Last Year:   . Arboriculturist in the Last Year:   Transportation Needs:   . Film/video editor (Medical):   Marland Kitchen Lack of Transportation (Non-Medical):   Physical Activity:   . Days of Exercise per Week:   . Minutes of Exercise per Session:   Stress:   . Feeling of Stress :   Social Connections:   . Frequency of Communication with Friends and Family:   . Frequency of Social Gatherings with Friends and Family:   . Attends Religious Services:   . Active  Member of Clubs or Organizations:   . Attends Archivist Meetings:   Marland Kitchen Marital Status:   Intimate Partner Violence:   . Fear of Current or Ex-Partner:   . Emotionally Abused:   Marland Kitchen Physically Abused:   . Sexually Abused:     FAMILY HISTORY: History reviewed. No pertinent family history.  ALLERGIES:  has No Known Allergies.  MEDICATIONS:  Current Outpatient Medications  Medication Sig Dispense Refill  . montelukast (SINGULAIR) 10 MG tablet Take 10 mg by mouth at bedtime.    . SYMBICORT 160-4.5 MCG/ACT inhaler Inhale 1 puff into the lungs 2  (two) times daily.    Marland Kitchen albuterol (PROVENTIL HFA;VENTOLIN HFA) 108 (90 Base) MCG/ACT inhaler Inhale 1 puff into the lungs every 6 (six) hours as needed for wheezing or shortness of breath.     No current facility-administered medications for this visit.   Facility-Administered Medications Ordered in Other Visits  Medication Dose Route Frequency Provider Last Rate Last Admin  . 0.9 %  sodium chloride infusion (Manually program via Guardrails IV Fluids)  250 mL Intravenous Once Heath Lark, MD        REVIEW OF SYSTEMS:   Constitutional: Denies fevers, chills or abnormal night sweats Eyes: Denies blurriness of vision, double vision or watery eyes Ears, nose, mouth, throat, and face: Denies mucositis or sore throat Cardiovascular: Denies palpitation, chest discomfort or lower extremity swelling Gastrointestinal:  Denies nausea, heartburn or change in bowel habits Skin: Denies abnormal skin rashes Lymphatics: Denies new lymphadenopathy or easy bruising Neurological:Denies numbness, tingling or new weaknesses Behavioral/Psych: Mood is stable, no new changes  All other systems were reviewed with the patient and are negative.  PHYSICAL EXAMINATION: ECOG PERFORMANCE STATUS: 1 - Symptomatic but completely ambulatory  Vitals:   08/19/19 1331  BP: 138/81  Pulse: (!) 115  Resp: 18  Temp: 98.6 F (37 C)  SpO2: 100%   Filed Weights   08/19/19 1331  Weight: 207 lb 12.8 oz (94.3 kg)    GENERAL:alert, no distress and comfortable. SKIN: skin color, texture, turgor are normal, no rashes or significant lesions EYES: normal, conjunctiva are pale and non-injected, sclera clear OROPHARYNX:no exudate, normal lips, buccal mucosa, and tongue  NECK: supple, thyroid normal size, non-tender, without nodularity LYMPH:  no palpable lymphadenopathy in the cervical, axillary or inguinal LUNGS: clear to auscultation and percussion with normal breathing effort HEART: regular rate & rhythm and no murmurs  with mild bilateral lower extremity edema ABDOMEN:abdomen soft, non-tender and normal bowel sounds Musculoskeletal:no cyanosis of digits and no clubbing  PSYCH: alert & oriented x 3 with fluent speech NEURO: no focal motor/sensory deficits  LABORATORY DATA:  I have reviewed the data as listed Lab Results  Component Value Date   WBC 8.2 08/19/2019   HGB 6.8 (LL) 08/19/2019   HCT 24.6 (L) 08/19/2019   MCV 67.0 (L) 08/19/2019   PLT 484 (H) 08/19/2019   No results for input(s): NA, K, CL, CO2, GLUCOSE, BUN, CREATININE, CALCIUM, GFRNONAA, GFRAA, PROT, ALBUMIN, AST, ALT, ALKPHOS, BILITOT, BILIDIR, IBILI in the last 8760 hours.  RADIOGRAPHIC STUDIES: I have personally reviewed the radiological images as listed and agreed with the findings in the report. US BREAST LTD UNI RIGHT INC AXILLA  Result Date: 08/13/2019 CLINICAL DATA:  49 year old female presenting for evaluation right breast pain and spontaneous clear discharge from 1 duct.She has had bilateral nipple piercings since 1998. EXAM: DIGITAL DIAGNOSTIC BILATERAL MAMMOGRAM WITH CAD AND TOMO RIGHT BREAST ULTRASOUND COMPARISON:  None. ACR Breast  Density Category c: The breast tissue is heterogeneously dense, which may obscure small masses. FINDINGS: There is an asymmetry in the retroareolar right breast as well as a dilated duct extending from the asymmetry to the nipple. No other suspicious calcifications, masses or areas of distortion are seen in the bilateral breasts. Mammographic images were processed with CAD. Ultrasound targeted to the right breast 12:30, 1 cm from the nipple demonstrates a vascular intraductal mass measuring approximately 2.3 x 0.6 x 1.3 cm. Ultrasound of the right axilla demonstrates multiple normal-appearing lymph nodes. IMPRESSION: 1. There is a 2.3 cm intraductal mass in the retroareolar right breast, likely the cause of the patient's nipple discharge. 2.  No evidence of right axillary lymphadenopathy. 3.  No evidence of  malignancy in the left breast. RECOMMENDATION: Ultrasound guided biopsy is recommended for the right breast mass. This has been scheduled for 08/14/2019 at 7:30 a.m. I have discussed the findings and recommendations with the patient. If applicable, a reminder letter will be sent to the patient regarding the next appointment. BI-RADS CATEGORY  4: Suspicious. Electronically Signed   By: Ammie Ferrier M.D.   On: 08/13/2019 15:27   MM DIAG BREAST TOMO BILATERAL  Result Date: 08/13/2019 CLINICAL DATA:  49 year old female presenting for evaluation right breast pain and spontaneous clear discharge from 1 duct.She has had bilateral nipple piercings since 1998. EXAM: DIGITAL DIAGNOSTIC BILATERAL MAMMOGRAM WITH CAD AND TOMO RIGHT BREAST ULTRASOUND COMPARISON:  None. ACR Breast Density Category c: The breast tissue is heterogeneously dense, which may obscure small masses. FINDINGS: There is an asymmetry in the retroareolar right breast as well as a dilated duct extending from the asymmetry to the nipple. No other suspicious calcifications, masses or areas of distortion are seen in the bilateral breasts. Mammographic images were processed with CAD. Ultrasound targeted to the right breast 12:30, 1 cm from the nipple demonstrates a vascular intraductal mass measuring approximately 2.3 x 0.6 x 1.3 cm. Ultrasound of the right axilla demonstrates multiple normal-appearing lymph nodes. IMPRESSION: 1. There is a 2.3 cm intraductal mass in the retroareolar right breast, likely the cause of the patient's nipple discharge. 2.  No evidence of right axillary lymphadenopathy. 3.  No evidence of malignancy in the left breast. RECOMMENDATION: Ultrasound guided biopsy is recommended for the right breast mass. This has been scheduled for 08/14/2019 at 7:30 a.m. I have discussed the findings and recommendations with the patient. If applicable, a reminder letter will be sent to the patient regarding the next appointment. BI-RADS CATEGORY   4: Suspicious. Electronically Signed   By: Ammie Ferrier M.D.   On: 08/13/2019 15:27   MM CLIP PLACEMENT RIGHT  Result Date: 08/14/2019 CLINICAL DATA:  Evaluate post biopsy marker clip placement following ultrasound-guided core needle biopsy of the right breast. EXAM: DIAGNOSTIC RIGHT MAMMOGRAM POST ULTRASOUND BIOPSY COMPARISON:  Previous exam(s). FINDINGS: Mammographic images were obtained following ultrasound guided biopsy of an intraductal mass at the 12:30 o'clock, retroareolar position of the right breast. The biopsy marking clip is in expected position at the site of biopsy. IMPRESSION: Appropriate positioning of the ribbon shaped biopsy marking clip at the site of biopsy in the upper outer retroareolar right breast. Final Assessment: Post Procedure Mammograms for Marker Placement Electronically Signed   By: Lajean Manes M.D.   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 This was found to be concordant by Dr. Lajean Manes, with excision recommended. Pathology results were discussed with the patient by telephone. The patient reported doing well after the biopsy with tenderness at the site. Post biopsy instructions and care were reviewed and questions were answered. The patient was encouraged to call [facility] for any additional concerns. Surgical consultation has been arranged with Dr. Nedra Hai at [facility] on September 06, 2019. Pathology results reported by Stacie Acres RN on 08/15/2019. Electronically Signed   By: Lajean Manes M.D.   On: 08/15/2019 14:04   Result Date: 08/15/2019 CLINICAL DATA:  Patient presents for ultrasound-guided core needle biopsy of a right breast intraductal mass. EXAM: ULTRASOUND GUIDED RIGHT BREAST CORE NEEDLE BIOPSY  COMPARISON:  Previous exam(s). PROCEDURE: I met with the patient and we discussed the procedure of ultrasound-guided biopsy, including benefits and alternatives. We discussed the high likelihood of a successful procedure. We discussed the risks of the procedure, including infection, bleeding, tissue injury, clip migration, and inadequate sampling. Informed written consent was given. The usual time-out protocol was performed immediately prior to the procedure. Lesion quadrant: Upper outer quadrant: 12:30 o'clock, 1 cm from the nipple. Using sterile technique and 1% Lidocaine as local anesthetic, under direct ultrasound visualization, a 12 gauge spring-loaded device was used to perform biopsy of the intraductal mass using a lateral approach. At the conclusion of the procedure ribbon shaped tissue marker clip was deployed into the biopsy cavity. Follow up 2 view mammogram was performed and dictated separately. IMPRESSION: Ultrasound guided biopsy of a right breast intraductal mass. No apparent complications. Electronically Signed: By: Lajean Manes M.D. On: 08/14/2019 08:04    ASSESSMENT & PLAN:  Iron deficiency anemia She is profoundly symptomatic from anemia Her hemoglobin is very low I recommend blood transfusion first before we proceed with intravenous iron infusion and she agreed We discussed some of the risks, benefits, and alternatives of blood transfusions. The patient is symptomatic from anemia and the hemoglobin level is critically low.  Some of the side-effects to be expected including risks of transfusion reactions, chills, infection, syndrome of volume overload and risk of hospitalization from various reasons and the patient is willing to proceed and went ahead to sign consent today. I will transfuse her with 1 unit of blood today  The most likely cause of her anemia is due to chronic blood loss/malabsorption syndrome/vegetarian diet We discussed some of the risks, benefits, and alternatives of  intravenous iron infusions. The patient is symptomatic from anemia and the iron level is critically low. She tolerated oral iron supplement poorly and desires to achieved higher levels of iron faster for adequate hematopoesis. Some of the side-effects to be expected including risks of infusion reactions, phlebitis, headaches, nausea and fatigue.  The patient is willing to proceed. Patient education material was dispensed.  Goal is to keep ferritin level greater than 50 and resolution of anemia She will start intravenous iron infusion in June After 2 doses, I will bring her back in recheck her blood again next month for further follow-up She might need further iron infusion   Menorrhagia with regular cycle She has appointment to follow-up with GYN for management of menorrhagia If she continues to have unexplained blood loss despite treatment for menorrhagia, we will have to refer her to GI for evaluation  Papilloma of right breast She is referred for lumpectomy/general surgery assessment due to recent findings  of papilloma of the breast   Orders Placed This Encounter  Procedures  . Informed Consent Details: Physician/Practitioner Attestation; Transcribe to consent form and obtain patient signature    Standing Status:   Future    Standing Expiration Date:   08/18/2020    Order Specific Question:   Physician/Practitioner attestation of informed consent for blood and or blood product transfusion    Answer:   I, the physician/practitioner, attest that I have discussed with the patient the benefits, risks, side effects, alternatives, likelihood of achieving goals and potential problems during recovery for the procedure that I have provided informed consent.    Order Specific Question:   Product(s)    Answer:   All Product(s)  . Care order/instruction    Transfuse Parameters    Standing Status:   Future    Standing Expiration Date:   08/18/2020  . Type and screen    Standing Status:   Future     Number of Occurrences:   1    Standing Expiration Date:   08/18/2020  . Prepare RBC (crossmatch)    Standing Status:   Standing    Number of Occurrences:   1    Order Specific Question:   # of Units    Answer:   1 unit    Order Specific Question:   Transfusion Indications    Answer:   Symptomatic Anemia    Order Specific Question:   Number of Units to Keep Ahead    Answer:   NO units ahead    Order Specific Question:   Instructions:    Answer:   Transfuse    Order Specific Question:   If emergent release call blood bank    Answer:   Not emergent release    All questions were answered. The patient knows to call the clinic with any problems, questions or concerns. The total time spent in the appointment was 60 minutes encounter with patients including review of chart and various tests results, discussions about plan of care and coordination of care plan   Heath Lark, MD 08/19/2019 3:30 PM

## 2019-08-20 LAB — TYPE AND SCREEN
ABO/RH(D): O POS
Antibody Screen: NEGATIVE
Unit division: 0

## 2019-08-20 LAB — BPAM RBC
Blood Product Expiration Date: 202106232359
ISSUE DATE / TIME: 202105241555
Unit Type and Rh: 5100

## 2019-08-23 ENCOUNTER — Other Ambulatory Visit: Payer: Self-pay

## 2019-08-23 ENCOUNTER — Encounter: Payer: Self-pay | Admitting: Emergency Medicine

## 2019-08-23 ENCOUNTER — Ambulatory Visit: Payer: BC Managed Care – PPO | Admitting: Emergency Medicine

## 2019-08-23 DIAGNOSIS — J4530 Mild persistent asthma, uncomplicated: Secondary | ICD-10-CM

## 2019-08-23 DIAGNOSIS — J45909 Unspecified asthma, uncomplicated: Secondary | ICD-10-CM | POA: Insufficient documentation

## 2019-08-23 NOTE — Addendum Note (Signed)
Addended by: Gavin Potters R on: 08/23/2019 03:47 PM   Modules accepted: Orders

## 2019-08-23 NOTE — Assessment & Plan Note (Signed)
Less control over the last several months.  She has allergic phenotype but eosinophil count 2 days ago normal.  She just started Symbicort and Singulair and would like to see if she responds, plan to continue.  Check IgE and full PFT.  She may require referral back to allergy for repeat allergy testing and possibly immunotherapy.  Will be interesting to correlate her asthma with her history of apparent cutaneous eosinophilic granulomas.  We will continue Symbicort 2 puffs twice a day.  Rinse gargle after using. Keep your albuterol available use 2 puffs when you need it for shortness of breath, chest tightness, wheezing. Continue Singulair 10 mg each evening Consider restarting your loratadine (Claritin) 10 mg once daily if you have increased allergy symptoms, congestion, drainage We will perform full pulmonary function testing at your next office visit Lab work today (IgE) Depending on progress we may decide to revisit allergy evaluation, allergy shots Follow with Dr. Lamonte Sakai next available with full pulmonary function testing on the same day.

## 2019-08-23 NOTE — Addendum Note (Signed)
Addended by: Gavin Potters R on: 08/23/2019 03:46 PM   Modules accepted: Orders

## 2019-08-23 NOTE — Progress Notes (Signed)
Subjective:    Patient ID: Terri Kirby, female    DOB: 21-Sep-1970, 49 y.o.   MRN: [id number]  HPI 49 year old never smoker with a history of iron deficiency anemia (Dr Alvy Bimler), new dx R intraductal breast carcinoma, childhood asthma. Less symptomatic in teen years, then worse into adulthood.   She is having more symptoms, increased morning and evening sx, can wake her at night. Was recently started on Singulair,  Symbicort 1 month ago due to these sx. Allergy sx better, SOB and wheeze are probably the same. Using her albuterol about 2x a day. Minimal cough. Nocturnal awakenings a few times a week.   Allergy tested in 2017 - ragweed, pollens, oranges, cats; formerly on immunotherapy, not currently. CBC from 2 days ago reviewed, eosinophil count normal.  She has papular hyperpigmented lesions that were bx'd and showed eosinophils  Last flare with prednisone was 2016. She has triggers that include hot/humid, exercise, certain perfumes, smoke, pet dander   Review of Systems As per HPI  Past Medical History:  Diagnosis Date  . Anemia   . Iron deficiency anemia 11/13/2015     No family history on file.   Social History   Socioeconomic History  . Marital status: Single    Spouse name: Not on file  . Number of children: 0  . Years of education: Not on file  . Highest education level: Not on file  Occupational History  . Occupation: Technical brewer: [employer]  Tobacco Use  . Smoking status: Never Smoker  . Smokeless tobacco: Never Used  Substance and Sexual Activity  . Alcohol use: Yes    Comment: socially  . Drug use: Not on file  . Sexual activity: Not on file  Other Topics Concern  . Not on file  Social History Narrative   Vegetarian since 3   Social Determinants of Health   Financial Resource Strain:   . Difficulty of Paying Living Expenses:   Food Insecurity:   . Worried About Charity fundraiser in the Last Year:   . Academic librarian in the Last Year:   Transportation Needs:   . Film/video editor (Medical):   Marland Kitchen Lack of Transportation (Non-Medical):   Physical Activity:   . Days of Exercise per Week:   . Minutes of Exercise per Session:   Stress:   . Feeling of Stress :   Social Connections:   . Frequency of Communication with Friends and Family:   . Frequency of Social Gatherings with Friends and Family:   . Attends Religious Services:   . Active Member of Clubs or Organizations:   . Attends Archivist Meetings:   Marland Kitchen Marital Status:   Intimate Partner Violence:   . Fear of Current or Ex-Partner:   . Emotionally Abused:   Marland Kitchen Physically Abused:   . Sexually Abused:     Works for the state in Manufacturing systems engineer; formerly [place] - exposed to some mold.  From [employer] Has lived in [place] and [place]   No Known Allergies   Outpatient Medications Prior to Visit  Medication Sig Dispense Refill  . albuterol (PROVENTIL HFA;VENTOLIN HFA) 108 (90 Base) MCG/ACT inhaler Inhale 1 puff into the lungs every 6 (six) hours as needed for wheezing or shortness of breath.    . montelukast (SINGULAIR) 10 MG tablet Take 10 mg by mouth at bedtime.    . SYMBICORT 160-4.5 MCG/ACT inhaler Inhale 1 puff into the  lungs 2 (two) times daily.     No facility-administered medications prior to visit.        Objective:   Physical Exam Vitals:   08/23/19 1515  BP: 118/64  Pulse: 91  Temp: 98 F (36.7 C)  TempSrc: Oral  SpO2: 99%  Weight: 203 lb 9.6 oz (92.4 kg)  Height: 5\' 7"  (1.702 m)   Gen: Pleasant, overwt woman, in no distress,  normal affect  ENT: No lesions,  mouth clear,  oropharynx clear, no postnasal drip  Neck: No JVD, no stridor  Lungs: No use of accessory muscles, no crackles or wheezing on normal respiration, no wheeze on forced expiration  Cardiovascular: RRR, heart sounds normal, no murmur or gallops, no peripheral edema  Musculoskeletal: No deformities, no cyanosis or  clubbing  Neuro: alert, awake, non focal  Skin: some old hyperpigmented papules, now apparently scars, on her arms       Assessment & Plan:  Asthma Less control over the last several months.  She has allergic phenotype but eosinophil count 2 days ago normal.  She just started Symbicort and Singulair and would like to see if she responds, plan to continue.  Check IgE and full PFT.  She may require referral back to allergy for repeat allergy testing and possibly immunotherapy.  Will be interesting to correlate her asthma with her history of apparent cutaneous eosinophilic granulomas.  We will continue Symbicort 2 puffs twice a day.  Rinse gargle after using. Keep your albuterol available use 2 puffs when you need it for shortness of breath, chest tightness, wheezing. Continue Singulair 10 mg each evening Consider restarting your loratadine (Claritin) 10 mg once daily if you have increased allergy symptoms, congestion, drainage We will perform full pulmonary function testing at your next office visit Lab work today (IgE) Depending on progress we may decide to revisit allergy evaluation, allergy shots Follow with Dr. Lamonte Sakai next available with full pulmonary function testing on the same day.   Baltazar Apo, MD, PhD 08/23/2019, 3:44 PM [facility] [phone] or if no answer [phone]

## 2019-08-23 NOTE — Patient Instructions (Addendum)
We will continue Symbicort 2 puffs twice a day.  Rinse gargle after using. Keep your albuterol available use 2 puffs when you need it for shortness of breath, chest tightness, wheezing. Continue Singulair 10 mg each evening Consider restarting your loratadine (Claritin) 10 mg once daily if you have increased allergy symptoms, congestion, drainage We will perform full pulmonary function testing at your next office visit Lab work today (IgE) Depending on progress we may decide to revisit allergy evaluation, allergy shots Follow with Dr. Lamonte Sakai next available with full pulmonary function testing on the same day.

## 2019-09-04 ENCOUNTER — Telehealth: Payer: Self-pay | Admitting: Hematology and Oncology

## 2019-09-04 NOTE — Telephone Encounter (Signed)
R/s apt per 6/7 staff message for infusions to be rescheduled to a week apart .  Pt is aware of appts.

## 2019-09-05 ENCOUNTER — Inpatient Hospital Stay: Payer: BC Managed Care – PPO

## 2019-09-05 ENCOUNTER — Telehealth: Payer: Self-pay

## 2019-09-05 ENCOUNTER — Other Ambulatory Visit: Payer: Self-pay | Admitting: Obstetrics and Gynecology

## 2019-09-05 ENCOUNTER — Inpatient Hospital Stay: Payer: BC Managed Care – PPO | Attending: Hematology and Oncology

## 2019-09-05 ENCOUNTER — Other Ambulatory Visit: Payer: BC Managed Care – PPO

## 2019-09-05 ENCOUNTER — Other Ambulatory Visit: Payer: Self-pay

## 2019-09-05 VITALS — BP 125/79 | HR 83 | Temp 98.7°F | Resp 18

## 2019-09-05 DIAGNOSIS — N921 Excessive and frequent menstruation with irregular cycle: Secondary | ICD-10-CM

## 2019-09-05 DIAGNOSIS — D259 Leiomyoma of uterus, unspecified: Secondary | ICD-10-CM

## 2019-09-05 DIAGNOSIS — N920 Excessive and frequent menstruation with regular cycle: Secondary | ICD-10-CM

## 2019-09-05 DIAGNOSIS — D500 Iron deficiency anemia secondary to blood loss (chronic): Secondary | ICD-10-CM | POA: Diagnosis not present

## 2019-09-05 DIAGNOSIS — D539 Nutritional anemia, unspecified: Secondary | ICD-10-CM

## 2019-09-05 DIAGNOSIS — D508 Other iron deficiency anemias: Secondary | ICD-10-CM

## 2019-09-05 LAB — CBC WITH DIFFERENTIAL/PLATELET
Abs Immature Granulocytes: 0.03 10*3/uL (ref 0.00–0.07)
Basophils Absolute: 0.1 10*3/uL (ref 0.0–0.1)
Basophils Relative: 1 %
Eosinophils Absolute: 0.1 10*3/uL (ref 0.0–0.5)
Eosinophils Relative: 1 %
HCT: 27.2 % — ABNORMAL LOW (ref 36.0–46.0)
Hemoglobin: 8.1 g/dL — ABNORMAL LOW (ref 12.0–15.0)
Immature Granulocytes: 0 %
Lymphocytes Relative: 20 %
Lymphs Abs: 1.8 10*3/uL (ref 0.7–4.0)
MCH: 21.4 pg — ABNORMAL LOW (ref 26.0–34.0)
MCHC: 29.8 g/dL — ABNORMAL LOW (ref 30.0–36.0)
MCV: 71.8 fL — ABNORMAL LOW (ref 80.0–100.0)
Monocytes Absolute: 0.7 10*3/uL (ref 0.1–1.0)
Monocytes Relative: 7 %
Neutro Abs: 6.3 10*3/uL (ref 1.7–7.7)
Neutrophils Relative %: 71 %
Platelets: 278 10*3/uL (ref 150–400)
RBC: 3.79 MIL/uL — ABNORMAL LOW (ref 3.87–5.11)
RDW: 24.1 % — ABNORMAL HIGH (ref 11.5–15.5)
WBC: 9 10*3/uL (ref 4.0–10.5)
nRBC: 0 % (ref 0.0–0.2)

## 2019-09-05 LAB — SAMPLE TO BLOOD BANK

## 2019-09-05 MED ORDER — SODIUM CHLORIDE 0.9 % IV SOLN
Freq: Once | INTRAVENOUS | Status: AC
  Filled 2019-09-05: qty 250

## 2019-09-05 MED ORDER — SODIUM CHLORIDE 0.9 % IV SOLN
510.0000 mg | Freq: Once | INTRAVENOUS | Status: AC
  Administered 2019-09-05: 510 mg via INTRAVENOUS
  Filled 2019-09-05: qty 510

## 2019-09-05 NOTE — Patient Instructions (Signed)

## 2019-09-05 NOTE — Telephone Encounter (Signed)
BB Hold HGB 8.1

## 2019-09-05 NOTE — Telephone Encounter (Signed)
Per Dr. Alvy Bimler Pt informed labs are looking better and she will continue the IV Iron. Message relayed to infusion RN Bethena Roys who stated she would relay the message.

## 2019-09-06 ENCOUNTER — Other Ambulatory Visit: Payer: BC Managed Care – PPO

## 2019-09-06 ENCOUNTER — Ambulatory Visit: Payer: BC Managed Care – PPO

## 2019-09-06 ENCOUNTER — Other Ambulatory Visit: Payer: Self-pay | Admitting: Surgery

## 2019-09-06 DIAGNOSIS — D241 Benign neoplasm of right breast: Secondary | ICD-10-CM

## 2019-09-10 ENCOUNTER — Ambulatory Visit
Admission: RE | Admit: 2019-09-10 | Discharge: 2019-09-10 | Disposition: A | Discharge status: Home / Self Care | Payer: BC Managed Care – PPO | Source: Ambulatory Visit | Attending: Obstetrics and Gynecology | Admitting: Obstetrics and Gynecology

## 2019-09-10 DIAGNOSIS — D259 Leiomyoma of uterus, unspecified: Secondary | ICD-10-CM

## 2019-09-10 DIAGNOSIS — N921 Excessive and frequent menstruation with irregular cycle: Secondary | ICD-10-CM

## 2019-09-13 ENCOUNTER — Inpatient Hospital Stay: Payer: BC Managed Care – PPO

## 2019-09-13 ENCOUNTER — Other Ambulatory Visit: Payer: Self-pay

## 2019-09-13 VITALS — BP 124/78 | HR 86 | Temp 98.9°F | Resp 16

## 2019-09-13 DIAGNOSIS — D500 Iron deficiency anemia secondary to blood loss (chronic): Secondary | ICD-10-CM | POA: Diagnosis not present

## 2019-09-13 DIAGNOSIS — D508 Other iron deficiency anemias: Secondary | ICD-10-CM

## 2019-09-13 DIAGNOSIS — D539 Nutritional anemia, unspecified: Secondary | ICD-10-CM

## 2019-09-13 DIAGNOSIS — N920 Excessive and frequent menstruation with regular cycle: Secondary | ICD-10-CM

## 2019-09-13 LAB — CBC WITH DIFFERENTIAL/PLATELET
Abs Immature Granulocytes: 0.04 10*3/uL (ref 0.00–0.07)
Basophils Absolute: 0.1 10*3/uL (ref 0.0–0.1)
Basophils Relative: 1 %
Eosinophils Absolute: 0.1 10*3/uL (ref 0.0–0.5)
Eosinophils Relative: 1 %
HCT: 32.5 % — ABNORMAL LOW (ref 36.0–46.0)
Hemoglobin: 9.5 g/dL — ABNORMAL LOW (ref 12.0–15.0)
Immature Granulocytes: 1 %
Lymphocytes Relative: 18 %
Lymphs Abs: 1.5 10*3/uL (ref 0.7–4.0)
MCH: 22.2 pg — ABNORMAL LOW (ref 26.0–34.0)
MCHC: 29.2 g/dL — ABNORMAL LOW (ref 30.0–36.0)
MCV: 75.9 fL — ABNORMAL LOW (ref 80.0–100.0)
Monocytes Absolute: 0.6 10*3/uL (ref 0.1–1.0)
Monocytes Relative: 7 %
Neutro Abs: 6.2 10*3/uL (ref 1.7–7.7)
Neutrophils Relative %: 72 %
Platelets: 374 10*3/uL (ref 150–400)
RBC: 4.28 MIL/uL (ref 3.87–5.11)
RDW: 28.9 % — ABNORMAL HIGH (ref 11.5–15.5)
WBC: 8.5 10*3/uL (ref 4.0–10.5)
nRBC: 0 % (ref 0.0–0.2)

## 2019-09-13 LAB — SAMPLE TO BLOOD BANK

## 2019-09-13 MED ORDER — SODIUM CHLORIDE 0.9 % IV SOLN
510.0000 mg | Freq: Once | INTRAVENOUS | Status: AC
  Administered 2019-09-13: 510 mg via INTRAVENOUS
  Filled 2019-09-13: qty 17

## 2019-09-13 MED ORDER — SODIUM CHLORIDE 0.9 % IV SOLN
Freq: Once | INTRAVENOUS | Status: AC
  Filled 2019-09-13: qty 250

## 2019-09-13 NOTE — Patient Instructions (Signed)

## 2019-09-16 ENCOUNTER — Other Ambulatory Visit: Payer: Self-pay | Admitting: Obstetrics and Gynecology

## 2019-09-17 ENCOUNTER — Other Ambulatory Visit: Payer: Self-pay | Admitting: Obstetrics and Gynecology

## 2019-09-17 DIAGNOSIS — D259 Leiomyoma of uterus, unspecified: Secondary | ICD-10-CM

## 2019-09-20 ENCOUNTER — Telehealth: Payer: Self-pay | Admitting: Hematology and Oncology

## 2019-09-20 NOTE — Telephone Encounter (Signed)
Release: [id number] Faxed medical records to [facility] @ Fax #[phone]

## 2019-09-23 ENCOUNTER — Other Ambulatory Visit: Payer: Self-pay | Admitting: Hematology and Oncology

## 2019-09-23 DIAGNOSIS — D508 Other iron deficiency anemias: Secondary | ICD-10-CM

## 2019-10-03 ENCOUNTER — Ambulatory Visit
Admission: RE | Admit: 2019-10-03 | Discharge: 2019-10-03 | Disposition: A | Discharge status: Home / Self Care | Payer: BC Managed Care – PPO | Source: Ambulatory Visit | Attending: Obstetrics and Gynecology | Admitting: Obstetrics and Gynecology

## 2019-10-03 ENCOUNTER — Other Ambulatory Visit: Payer: Self-pay | Admitting: Interventional Radiology

## 2019-10-03 ENCOUNTER — Encounter: Payer: Self-pay | Admitting: *Deleted

## 2019-10-03 ENCOUNTER — Other Ambulatory Visit: Payer: Self-pay

## 2019-10-03 DIAGNOSIS — D259 Leiomyoma of uterus, unspecified: Secondary | ICD-10-CM

## 2019-10-03 HISTORY — PX: IR RADIOLOGIST EVAL & MGMT: IMG5224

## 2019-10-03 NOTE — Consult Note (Signed)
Chief Complaint: Symptomatic uterine fibroids.   Referring Physician(s): Cole,Tara (Ob-Gyn) Gorsuch (Hematology) Ninfa Linden (Surgery) Earleen Newport (IR) Laurence Ferrari (IR)  History of Present Illness: Terri Kirby is a 49 y.o.(G0, P0) female with past no history significant for asthma and iron deficiency anemia felt to be attributable to chronic blood loss/malabsorption syndrome/vegetarian diet.  Patient was initially diagnosed with uterine fibroids in 2017 during work-up for menorrhagia.  She reports a long history of heavy menorrhagia though states this has recently worsened recently.  She states her cycle has remained regular occurring every 28 days however her cycle lasts approximately 9 to 10 days for several days which are associated with excessive menorrhagia including the changing of pads and tampons every 2 hours.  She admits to the passage of blood clots.  Patient has been diagnosed with profound iron deficiency anemia for which she received a blood transfusion approximately 1 month ago and since has undergone weekly iron infusions.  In regards to bulk symptoms, patient complains of frequent urination as well as crampy pelvic pain, worse at the time of her cycle.  The patient is not currently interested in fertility though wishes to avoid a hysterectomy.  Patient denies perimenopausal symptoms.  Of note, patient has recently been diagnosed with a benign breast papilloma for which will ultimately be removed by Dr. Ninfa Linden.   Past Medical History:  Diagnosis Date  . Anemia   . Iron deficiency anemia 11/13/2015    Past Surgical History:  Procedure Laterality Date  . SKIN BIOPSY      Allergies: Patient has no known allergies.  Medications: Prior to Admission medications   Medication Sig Start Date End Date Taking? Authorizing Provider  albuterol (PROVENTIL HFA;VENTOLIN HFA) 108 (90 Base) MCG/ACT inhaler Inhale 1 puff into the lungs every 6 (six) hours as needed for  wheezing or shortness of breath.    [provider]  montelukast (SINGULAIR) 10 MG tablet Take 10 mg by mouth at bedtime.    [provider]  SYMBICORT 160-4.5 MCG/ACT inhaler Inhale 1 puff into the lungs 2 (two) times daily. 07/24/19   [provider]     No family history on file.  Social History   Socioeconomic History  . Marital status: Single    Spouse name: Not on file  . Number of children: 0  . Years of education: Not on file  . Highest education level: Not on file  Occupational History  . Occupation: Technical brewer: [employer]  Tobacco Use  . Smoking status: Never Smoker  . Smokeless tobacco: Never Used  Substance and Sexual Activity  . Alcohol use: Yes    Comment: socially  . Drug use: Not on file  . Sexual activity: Not on file  Other Topics Concern  . Not on file  Social History Narrative   Vegetarian since 89   Social Determinants of Health   Financial Resource Strain:   . Difficulty of Paying Living Expenses:   Food Insecurity:   . Worried About Charity fundraiser in the Last Year:   . Arboriculturist in the Last Year:   Transportation Needs:   . Film/video editor (Medical):   Marland Kitchen Lack of Transportation (Non-Medical):   Physical Activity:   . Days of Exercise per Week:   . Minutes of Exercise per Session:   Stress:   . Feeling of Stress :   Social Connections:   . Frequency of Communication with Friends and Family:   .  Frequency of Social Gatherings with Friends and Family:   . Attends Religious Services:   . Active Member of Clubs or Organizations:   . Attends Archivist Meetings:   Marland Kitchen Marital Status:     ECOG Status: 1 - Symptomatic but completely ambulatory  Review of Systems  Review of Systems: A 12 point ROS discussed and pertinent positives are indicated in the HPI above.  All other systems are negative.  Physical Exam No direct physical exam was performed (except for  noted visual exam findings with Video Visits).   Vital Signs: There were no vitals taken for this visit.  Imaging: US PELVIC COMPLETE WITH TRANSVAGINAL  Result Date: 09/10/2019 CLINICAL DATA:  Evaluate uterine fibroids EXAM: TRANSABDOMINAL AND TRANSVAGINAL ULTRASOUND OF PELVIS TECHNIQUE: Both transabdominal and transvaginal ultrasound examinations of the pelvis were performed. Transabdominal technique was performed for global imaging of the pelvis including uterus, ovaries, adnexal regions, and pelvic cul-de-sac. It was necessary to proceed with endovaginal exam following the transabdominal exam to visualize the endometrium. COMPARISON:  09/14/2016 FINDINGS: Uterus Measurements: 19.8 by 10.8 x 11.1 cm = volume: 1,242 mL. Previously 18.2 x 10.3 x 14.6 cm (volume = 1430 cm^3). Three fibroids are identified: -dominant fibroid is noted within the fundus measures 10.1 x 9.5 x 12.4 cm -left anterior body fibroid measures 3.4 x 3.3 x 2.9 cm -right posterior body fibroid measures 2.4 x 2.4 x 3.0 cm. Endometrium Thickness: 7.4.  No focal abnormality visualized. Right ovary Not visualized Left ovary Not visualized Other findings No abnormal free fluid. IMPRESSION: 1. Enlarged fibroid uterus with dominant fundal fibroid measuring 10.1 cm in maximum dimension. 2. Nonvisualization of the ovaries. Electronically Signed   By: Kerby Moors M.D.   On: 09/10/2019 17:45    Labs:  CBC: Recent Labs    08/19/19 1357 09/05/19 1348 09/13/19 1347  WBC 8.2 9.0 8.5  HGB 6.8* 8.1* 9.5*  HCT 24.6* 27.2* 32.5*  PLT 484* 278 374    COAGS: No results for input(s): INR, APTT in the last 8760 hours.  BMP: No results for input(s): NA, K, CL, CO2, GLUCOSE, BUN, CALCIUM, CREATININE, GFRNONAA, GFRAA in the last 8760 hours.  Invalid input(s): CMP  LIVER FUNCTION TESTS: No results for input(s): BILITOT, AST, ALT, ALKPHOS, PROT, ALBUMIN in the last 8760 hours.  TUMOR MARKERS:    Assessment and Plan:  Terri Kirby is a 49 y.o.(G0, P0) female with past no history significant for asthma and iron deficiency anemia felt to be attributable to chronic blood loss/malabsorption syndrome/vegetarian diet.  Patient's primary fibroid related complaint is associated with her significant menorrhagia which has resulted in profound iron deficiency anemia for which she received a blood transfusion approximately 1 month ago and since has undergone weekly iron infusions.  She does also admit to bulk symptoms including frequent urination as well as crampy pelvic pain, worse at the time of her cycle.  Pelvic ultrasound performed 09/10/2019 demonstrates an enlarged myomatous uterus with dominant fundal fibroid measuring at least 10.1 cm in axial diameter.  Prolonged conversations were held with the patient regarding potential treatment options for her symptomatic uterine fibroids including continued conservative management and hysterectomy.  At the present time, the patient wishes to undergo intervention though wishes to avoid surgery.  Note, the patient underwent a negative endometrial biopsy in 09/17/2019, though I do not see a recent Pap smear.  As such, prolonged conversations were held with the patient regarding the benefits and risks (including but not limited to  bleeding, vessel injury, nontarget embolization, infection, contrast and radiation exposure) of uterine fibroid embolization.   Following this prolonged and detailed conversation, the patient wishes to pursue [place].  As such, I will obtain a contrast enhanced pelvic MRI to better delineate the fibroid size and burden and to ensure fibroid enhancement.   Ultimately, the uterine fibroid embolization will be performed at [facility].  The patient expressed interest in potentially having the procedure performed at an outpatient basis with the use of a hypoplastic block and potentially having intervention performed via the wrist.  I do not perform either the  hypogastric block or the [place] via the wrist and as such, I will have either Dr. Earleen Newport or Dr. Laurence Ferrari consult with the patient following the acquisition of the contrast-enhanced pelvic MRI to discuss the risks, benefits and feasibility of these interventions.  The patient demonstrated excellent understanding of this conversation and is agreement with the plan of care.  Plan: - Obtain a contrast-enhanced pelvic MRI to better assess burden, size and to ensure enhancement of uterine fibroids. - Following acquisition of the contrast-enhanced pelvic MRI, the patient will have a follow-up telemedicine consultation with either Dr. Earleen Newport or Dr. Laurence Ferrari to discuss feasibility of performing the intervention on outpatient basis via the wrist with a hypogastric block. - Ensure recent negative Pap smear result (note, patient underwent negative endometrial biopsy on 09/16/2019). - Coordinate care regarding the urine fibroid embolization and the papilloma breast surgery (Dr. Ninfa Linden) realizing the recovery from the embolization will likely be more prolonged than her breast surgery based on patient preference.  Thank you for this interesting consult.  I greatly enjoyed meeting Terri Kirby and look forward to participating in their care.  A copy of this report was sent to the requesting provider on this date.  Electronically Signed: Sandi Mariscal 10/03/2019, 10:37 AM   I spent a total of 40 Minutes in remote  clinical consultation, greater than 50% of which was counseling/coordinating care for symptomatic uterine fibroids.    Visit type: Audio only (telephone). Audio (no video) only due to patient's lack of internet/smartphone capability. Alternative for in-person consultation at [facility], [address], [place], [place]. This visit type was conducted due to national recommendations for restrictions regarding the COVID-19 Pandemic (e.g. social distancing).  This format is felt to be most  appropriate for this patient at this time.  All issues noted in this document were discussed and addressed.

## 2019-10-11 ENCOUNTER — Other Ambulatory Visit (HOSPITAL_COMMUNITY): Payer: BC Managed Care – PPO

## 2019-10-14 ENCOUNTER — Ambulatory Visit: Payer: BC Managed Care – PPO | Admitting: Adult Health

## 2019-10-14 ENCOUNTER — Ambulatory Visit (INDEPENDENT_AMBULATORY_CARE_PROVIDER_SITE_OTHER): Payer: BC Managed Care – PPO | Admitting: Emergency Medicine

## 2019-10-14 ENCOUNTER — Encounter: Payer: Self-pay | Admitting: Adult Health

## 2019-10-14 ENCOUNTER — Other Ambulatory Visit: Payer: Self-pay

## 2019-10-14 VITALS — BP 112/64 | HR 91 | Temp 98.4°F | Ht 67.0 in | Wt 206.0 lb

## 2019-10-14 DIAGNOSIS — J4530 Mild persistent asthma, uncomplicated: Secondary | ICD-10-CM

## 2019-10-14 DIAGNOSIS — J45909 Unspecified asthma, uncomplicated: Secondary | ICD-10-CM

## 2019-10-14 LAB — PULMONARY FUNCTION TEST
DL/VA % pred: 166 %
DL/VA: 7.09 ml/min/mmHg/L
DLCO cor % pred: 127 %
DLCO cor: 29.9 ml/min/mmHg
DLCO unc % pred: 108 %
DLCO unc: 25.58 ml/min/mmHg
FEF 25-75 Post: 3 L/sec
FEF 25-75 Pre: 2.38 L/sec
FEF2575-%Change-Post: 26 %
FEF2575-%Pred-Post: 107 %
FEF2575-%Pred-Pre: 85 %
FEV1-%Change-Post: 6 %
FEV1-%Pred-Post: 96 %
FEV1-%Pred-Pre: 90 %
FEV1-Post: 2.58 L
FEV1-Pre: 2.42 L
FEV1FVC-%Change-Post: 6 %
FEV1FVC-%Pred-Pre: 98 %
FEV6-%Change-Post: 0 %
FEV6-%Pred-Post: 93 %
FEV6-%Pred-Pre: 93 %
FEV6-Post: 3.02 L
FEV6-Pre: 3 L
FEV6FVC-%Pred-Post: 102 %
FEV6FVC-%Pred-Pre: 102 %
FVC-%Change-Post: 0 %
FVC-%Pred-Post: 91 %
FVC-%Pred-Pre: 90 %
FVC-Post: 3.02 L
FVC-Pre: 3 L
Post FEV1/FVC ratio: 86 %
Post FEV6/FVC ratio: 100 %
Pre FEV1/FVC ratio: 81 %
Pre FEV6/FVC Ratio: 100 %
RV % pred: 94 %
RV: 1.77 L
TLC % pred: 88 %
TLC: 4.89 L

## 2019-10-14 NOTE — Patient Instructions (Addendum)
Chest xray .  Continue on Symbicort 2 puffs Twice daily  , rinse after use.  May stop Singulair .  Labs today  Add Claritin 10mg  daily .  Saline nasal rinses As needed   Albuterol inhaler As needed   Follow up with .Dr. Lamonte Sakai  In 3 months and As needed   Please contact office for sooner follow up if symptoms do not improve or worsen or seek emergency care

## 2019-10-14 NOTE — Progress Notes (Signed)
Full PFT performed today. °

## 2019-10-14 NOTE — Progress Notes (Signed)
@Patient  ID: Terri Kirby, female    DOB: Apr 27, 1970, 49 y.o.   MRN: [id number]  Chief Complaint  Patient presents with  . Follow-up    Asthma     Referring provider: [name], MD  HPI: 49 year old female never smoker seen for pulmonary consult Aug 23, 2019 for asthma  Medical history significant for newly dx  right-sided breast intraductal breast carcinoma, anemia , dysfunctional bleeding from uterine fibroids.   TEST/EVENTS :  Allergy tested in 2017 - ragweed, pollens, oranges, cats; formerly on immunotherapy, not currently. al.  10/14/2019 Follow up : Asthma  Patient returns for 2 month follow-up.  Complains that she has seen some increased asthma symptoms over the month.  Symptoms seem to be worse at night and with activity.  She denies any chest pain orthopnea PND leg swelling.  She remains on Symbicort twice daily.  Says she has been on Singulair for a while has not noticed much improvement.  Does have some nasal congestion drainage.   Patient does have severe iron deficient anemia is following with hematology.  Patient recently had lab work that showed a hemoglobin at 6.8.  Patient had reported cravings for ice.  She has been a vegetarian since 1995.  She did receive a blood transfusion.  Most recent labs on September 13, 2019 showed hemoglobin had improved at 9.5.  She is also receiving iron transfusions.   No Known Allergies  Immunization History  Administered Date(s) Administered  . PFIZER SARS-COV-2 Vaccination 06/11/2019, 07/02/2019    Past Medical History:  Diagnosis Date  . Anemia   . Iron deficiency anemia 11/13/2015    Tobacco History: Social History   Tobacco Use  Smoking Status Never Smoker  Smokeless Tobacco Never Used   Counseling given: Not Answered   Outpatient Medications Prior to Visit  Medication Sig Dispense Refill  . albuterol (PROVENTIL HFA;VENTOLIN HFA) 108 (90 Base) MCG/ACT inhaler Inhale 1 puff into the lungs every 6 (six) hours as  needed for wheezing or shortness of breath.    . montelukast (SINGULAIR) 10 MG tablet Take 10 mg by mouth at bedtime.    . SYMBICORT 160-4.5 MCG/ACT inhaler Inhale 1 puff into the lungs 2 (two) times daily.     No facility-administered medications prior to visit.     Review of Systems:   Constitutional:   No  weight loss, night sweats,  Fevers, chills,  +fatigue, or  lassitude.  HEENT:   No headaches,  Difficulty swallowing,  Tooth/dental problems, or  Sore throat,                No sneezing, itching, ear ache, nasal congestion, post nasal drip,   CV:  No chest pain,  Orthopnea, PND, swelling in lower extremities, anasarca, dizziness, palpitations, syncope.   GI  No heartburn, indigestion, abdominal pain, nausea, vomiting, diarrhea, change in bowel habits, loss of appetite, bloody stools.   Resp:   No wheezing.  No chest wall deformity  Skin: no rash or lesions.  GU: no dysuria, change in color of urine, no urgency or frequency.  No flank pain, no hematuria   MS:  No joint pain or swelling.  No decreased range of motion.  No back pain.    Physical Exam  BP 112/64 (BP Location: Left Arm, Cuff Size: Normal)   Pulse 91   Temp 98.4 F (36.9 C) (Oral)   Ht 5\' 7"  (1.702 m)   Wt 206 lb (93.4 kg)   SpO2 99%  BMI 32.26 kg/m   GEN: A/Ox3; pleasant , NAD, well nourished    HEENT:  [place]/AT,   NOSE-clear, THROAT-clear, no lesions, no postnasal drip or exudate noted.   NECK:  Supple w/ fair ROM; no JVD; normal carotid impulses w/o bruits; no thyromegaly or nodules palpated; no lymphadenopathy.    RESP  Clear  P & A; w/o, wheezes/ rales/ or rhonchi. no accessory muscle use, no dullness to percussion  CARD:  RRR, no m/r/g, no peripheral edema, pulses intact, no cyanosis or clubbing.  GI:   Soft & nt; nml bowel sounds; no organomegaly or masses detected.   Musco: Warm bil, no deformities or joint swelling noted.   Neuro: alert, no focal deficits noted.    Skin: Warm, no  lesions or rashes    Lab Results:  CBC    Component Value Date/Time   WBC 8.5 09/13/2019 1347   RBC 4.28 09/13/2019 1347   HGB 9.5 (L) 09/13/2019 1347   HGB 11.9 04/01/2016 1403   HCT 32.5 (L) 09/13/2019 1347   HCT 35.3 04/01/2016 1403   PLT 374 09/13/2019 1347   PLT 197 04/01/2016 1403   MCV 75.9 (L) 09/13/2019 1347   MCV 88.0 04/01/2016 1403   MCH 22.2 (L) 09/13/2019 1347   MCHC 29.2 (L) 09/13/2019 1347   RDW 28.9 (H) 09/13/2019 1347   RDW 14.7 (H) 04/01/2016 1403   LYMPHSABS 1.5 09/13/2019 1347   LYMPHSABS 1.6 04/01/2016 1403   MONOABS 0.6 09/13/2019 1347   MONOABS 0.3 04/01/2016 1403   EOSABS 0.1 09/13/2019 1347   EOSABS 0.3 04/01/2016 1403   BASOSABS 0.1 09/13/2019 1347   BASOSABS 0.1 04/01/2016 1403    BMET No results found for: NA, K, CL, CO2, GLUCOSE, BUN, CREATININE, CALCIUM, GFRNONAA, GFRAA  BNP No results found for: BNP  ProBNP No results found for: PROBNP  Imaging: IR Radiologist Eval & Mgmt  Result Date: 10/03/2019 Please refer to notes tab for details about interventional procedure. (Op Note)   acetaminophen (TYLENOL) tablet 650 mg    Date Action Dose Route User   08/19/2019 1520 Given  Oral Wiley, Jacquelyn N, RN    diphenhydrAMINE (BENADRYL) capsule 25 mg    Date Action Dose Route User   08/19/2019 1520 Given  Oral Wiley, Jacquelyn N, RN    ferumoxytol [facility]) 510 mg in sodium chloride 0.9 % 100 mL IVPB    Date Action Dose Route User   09/05/2019 1529 Rate/Dose Change  (none) Rolene Course, RN   09/05/2019 1529 New Bag/Given  Intravenous Rolene Course, RN    ferumoxytol [facility]) 510 mg in sodium chloride 0.9 % 100 mL IVPB    Date Action Dose Route User   09/13/2019 1532 Rate/Dose Verify  (none) Jesse Fall, RN   09/13/2019 1515 Rate/Dose Change  (none) Jesse Fall, RN   09/13/2019 1514 New Bag/Given  Intravenous Jesse Fall, RN    0.9 %  sodium chloride infusion    Date Action Dose Route User   09/05/2019 1619 Rate/Dose  Verify  (none) Rolene Course, RN   09/05/2019 1549 Rate/Dose Verify  (none) Rolene Course, RN   09/05/2019 1549 Rate/Dose Change  (none) Rolene Course, RN   09/05/2019 1545 Rate/Dose Change  (none) Rolene Course, RN   09/05/2019 1545 Rate/Dose Verify  (none) Rolene Course, RN    0.9 %  sodium chloride infusion    Date Action Dose Route User   09/13/2019 1535 Rate/Dose Verify  (none)  Jesse Fall, RN   09/13/2019 1535 Restarted  (none) Jesse Fall, RN   09/13/2019 1530 Restarted  (none) Jesse Fall, RN   09/13/2019 1508 Rate/Dose Verify  (none) Jesse Fall, RN   09/13/2019 1507 New Bag/Given  Intravenous Jesse Fall, RN      PFT Results Latest Ref Rng & Units 10/14/2019  FVC-Pre L 3.00  FVC-Predicted Pre % 90  FVC-Post L 3.02  FVC-Predicted Post % 91  Pre FEV1/FVC % % 81  Post FEV1/FCV % % 86  FEV1-Pre L 2.42  FEV1-Predicted Pre % 90  FEV1-Post L 2.58  DLCO UNC% % 108  DLCO COR %Predicted % 166  TLC L 4.89  TLC % Predicted % 88  RV % Predicted % 94    No results found for: NITRICOXIDE      Assessment & Plan:   No problem-specific Assessment & Plan notes found for this encounter.     Rexene Edison, NP 10/14/2019

## 2019-10-16 LAB — IGE: IgE (Immunoglobulin E), Serum: 69 kU/L (ref <=114)

## 2019-10-17 ENCOUNTER — Telehealth: Payer: BC Managed Care – PPO

## 2019-10-17 ENCOUNTER — Ambulatory Visit (HOSPITAL_COMMUNITY)
Admission: RE | Admit: 2019-10-17 | Discharge: 2019-10-17 | Disposition: A | Discharge status: Home / Self Care | Payer: BC Managed Care – PPO | Source: Ambulatory Visit | Attending: Adult Health | Admitting: Adult Health

## 2019-10-17 ENCOUNTER — Ambulatory Visit (HOSPITAL_COMMUNITY)
Admission: RE | Admit: 2019-10-17 | Discharge: 2019-10-17 | Disposition: A | Discharge status: Home / Self Care | Payer: BC Managed Care – PPO | Source: Ambulatory Visit | Attending: Interventional Radiology | Admitting: Interventional Radiology

## 2019-10-17 ENCOUNTER — Other Ambulatory Visit: Payer: Self-pay

## 2019-10-17 DIAGNOSIS — J45909 Unspecified asthma, uncomplicated: Secondary | ICD-10-CM | POA: Insufficient documentation

## 2019-10-17 DIAGNOSIS — D259 Leiomyoma of uterus, unspecified: Secondary | ICD-10-CM | POA: Insufficient documentation

## 2019-10-17 MED ORDER — GADOBUTROL 1 MMOL/ML IV SOLN
9.0000 mL | Freq: Once | INTRAVENOUS | Status: AC | PRN
  Administered 2019-10-17: 9 mL via INTRAVENOUS

## 2019-10-18 NOTE — Assessment & Plan Note (Signed)
Asthma with increased symptoms suspect some symptoms may be due to her recent severe anemia with shortness of breath.  We will check chest x-ray today.  Check IgE level. No perceived clinical benefit from Singulair may discontinue  Plan  Patient Instructions  Chest xray .  Continue on Symbicort 2 puffs Twice daily  , rinse after use.  May stop Singulair .  Labs today  Add Claritin 10mg  daily .  Saline nasal rinses As needed   Albuterol inhaler As needed   Follow up with .Dr. Lamonte Sakai  In 3 months and As needed   Please contact office for sooner follow up if symptoms do not improve or worsen or seek emergency care

## 2019-10-21 ENCOUNTER — Inpatient Hospital Stay: Payer: BC Managed Care – PPO

## 2019-10-21 ENCOUNTER — Encounter: Payer: Self-pay | Admitting: Hematology and Oncology

## 2019-10-21 ENCOUNTER — Other Ambulatory Visit: Payer: Self-pay

## 2019-10-21 ENCOUNTER — Inpatient Hospital Stay (HOSPITAL_BASED_OUTPATIENT_CLINIC_OR_DEPARTMENT_OTHER): Payer: BC Managed Care – PPO | Admitting: Hematology and Oncology

## 2019-10-21 ENCOUNTER — Inpatient Hospital Stay: Payer: BC Managed Care – PPO | Attending: Hematology and Oncology

## 2019-10-21 VITALS — BP 139/74 | HR 71 | Temp 98.2°F | Resp 18

## 2019-10-21 DIAGNOSIS — D508 Other iron deficiency anemias: Secondary | ICD-10-CM

## 2019-10-21 DIAGNOSIS — D509 Iron deficiency anemia, unspecified: Secondary | ICD-10-CM | POA: Insufficient documentation

## 2019-10-21 DIAGNOSIS — N920 Excessive and frequent menstruation with regular cycle: Secondary | ICD-10-CM | POA: Diagnosis not present

## 2019-10-21 DIAGNOSIS — D539 Nutritional anemia, unspecified: Secondary | ICD-10-CM

## 2019-10-21 LAB — CBC WITH DIFFERENTIAL/PLATELET
Abs Immature Granulocytes: 0.02 10*3/uL (ref 0.00–0.07)
Basophils Absolute: 0 10*3/uL (ref 0.0–0.1)
Basophils Relative: 1 %
Eosinophils Absolute: 0.2 10*3/uL (ref 0.0–0.5)
Eosinophils Relative: 3 %
HCT: 36.3 % (ref 36.0–46.0)
Hemoglobin: 11.6 g/dL — ABNORMAL LOW (ref 12.0–15.0)
Immature Granulocytes: 0 %
Lymphocytes Relative: 21 %
Lymphs Abs: 1.4 10*3/uL (ref 0.7–4.0)
MCH: 27.2 pg (ref 26.0–34.0)
MCHC: 32 g/dL (ref 30.0–36.0)
MCV: 85 fL (ref 80.0–100.0)
Monocytes Absolute: 0.3 10*3/uL (ref 0.1–1.0)
Monocytes Relative: 4 %
Neutro Abs: 4.6 10*3/uL (ref 1.7–7.7)
Neutrophils Relative %: 71 %
Platelets: 296 10*3/uL (ref 150–400)
RBC: 4.27 MIL/uL (ref 3.87–5.11)
RDW: 22.8 % — ABNORMAL HIGH (ref 11.5–15.5)
WBC: 6.5 10*3/uL (ref 4.0–10.5)
nRBC: 0 % (ref 0.0–0.2)

## 2019-10-21 LAB — IRON AND TIBC
Iron: 40 ug/dL — ABNORMAL LOW (ref 41–142)
Saturation Ratios: 14 % — ABNORMAL LOW (ref 21–57)
TIBC: 276 ug/dL (ref 236–444)
UIBC: 236 ug/dL (ref 120–384)

## 2019-10-21 LAB — FERRITIN: Ferritin: 49 ng/mL (ref 11–307)

## 2019-10-21 MED ORDER — SODIUM CHLORIDE 0.9 % IV SOLN
INTRAVENOUS | Status: DC
  Filled 2019-10-21: qty 250

## 2019-10-21 MED ORDER — SODIUM CHLORIDE 0.9 % IV SOLN
510.0000 mg | Freq: Once | INTRAVENOUS | Status: AC
  Administered 2019-10-21: 510 mg via INTRAVENOUS
  Filled 2019-10-21: qty 17

## 2019-10-21 NOTE — Progress Notes (Signed)
[place] OFFICE PROGRESS NOTE  Terri Lass, MD  ASSESSMENT & PLAN:  Iron deficiency anemia She continues to have severe menorrhagia MRI of the pelvis confirmed uterine fibroids She is awaiting GYN appointment tomorrow  She is symptomatic from iron deficiency anemia The most likely cause of her anemia is due to chronic blood loss/malabsorption syndrome. We discussed some of the risks, benefits, and alternatives of intravenous iron infusions. The patient is symptomatic from anemia and the iron level is critically low. She tolerated oral iron supplement poorly and desires to achieved higher levels of iron faster for adequate hematopoesis. Some of the side-effects to be expected including risks of infusion reactions, phlebitis, headaches, nausea and fatigue.  The patient is willing to proceed. Patient education material was dispensed.  Goal is to keep ferritin level greater than 50 and resolution of anemia We will proceed with intravenous iron today in the next week I plan to see her back at the end of September for further follow-up    Orders Placed This Encounter  Procedures  . Iron and TIBC    Standing Status:   Future    Standing Expiration Date:   10/20/2020  . Ferritin    Standing Status:   Future    Standing Expiration Date:   10/20/2020    The total time spent in the appointment was 15 minutes encounter with patients including review of chart and various tests results, discussions about plan of care and coordination of care plan   All questions were answered. The patient knows to call the clinic with any problems, questions or concerns. No barriers to learning was detected.    Terri Lark, MD 7/26/202111:05 AM  INTERVAL HISTORY: Terri Kirby 49 y.o. female returns for recurrent severe iron deficiency anemia Her energy level has improved since she was last seen but she is still having heavy menstruation She continues to have pica with excessive chewing  of ice The patient denies any recent signs or symptoms of bleeding such as spontaneous epistaxis, hematuria or hematochezia. She has low energy level and shortness of breath on exertion  SUMMARY OF HEMATOLOGIC HISTORY: Terri Kirby 49 y.o. female was seen back in my office for further evaluation and management of severe iron deficiency anemia She was last seen in the office in 2017 Her office visit today is considered a new patient visit as more than 3 years have elapsed since she was last seen.  Summary of her history is as follows: She was found to have abnormal CBC from routine blood work through her primary care doctor's office.  She has pica with craving for paper and excessive chewing of ice. The patient has been a vegetarian since 1995  The patient was prescribed oral iron supplements and she takes daily but it causes significant constipation The patient has history of menorrhagia She has received blood transfusion and intravenous iron infusion in 2017 Since then, she was lost to follow-up Recently, she started to complain of excessive fatigue, bilateral leg swelling, dizziness and lightheadedness She denies chest pain but does have chronic shortness of breath from asthma She has heavy menstruation, her typical menstrual cycle is 7 to 8 days out of her cycle of every 28 days She have passage of large amount of clots The patient denies any recent signs or symptoms of bleeding such as spontaneous epistaxis, hematuria or hematochezia. She continues on her vegetarian diet She has excessive pica with chewing on ice She was told that she have  uterine fibroid and is in the process of seeing gynecologist for further management of menorrhagia  She had recent abnormal mammogram with biopsy that show breast papilloma She is in the process of being referred to see general surgery  In June 2021, she have received 2 doses of intravenous iron infusion I have reviewed the past medical  history, past surgical history, social history and family history with the patient and they are unchanged from previous note.  ALLERGIES:  has No Known Allergies.  MEDICATIONS:  Current Outpatient Medications  Medication Sig Dispense Refill  . ondansetron (ZOFRAN) 8 MG tablet Take 8 mg by mouth 2 (two) times daily as needed.    Marland Kitchen albuterol (PROVENTIL HFA;VENTOLIN HFA) 108 (90 Base) MCG/ACT inhaler Inhale 1 puff into the lungs every 6 (six) hours as needed for wheezing or shortness of breath.    . montelukast (SINGULAIR) 10 MG tablet Take 10 mg by mouth at bedtime.    . SYMBICORT 160-4.5 MCG/ACT inhaler Inhale 1 puff into the lungs 2 (two) times daily.     No current facility-administered medications for this visit.     REVIEW OF SYSTEMS:   Constitutional: Denies fevers, chills or night sweats Eyes: Denies blurriness of vision Ears, nose, mouth, throat, and face: Denies mucositis or sore throat Cardiovascular: Denies palpitation, chest discomfort or lower extremity swelling Gastrointestinal:  Denies nausea, heartburn or change in bowel habits Skin: Denies abnormal skin rashes Lymphatics: Denies new lymphadenopathy or easy bruising Neurological:Denies numbness, tingling or new weaknesses Behavioral/Psych: Mood is stable, no new changes  All other systems were reviewed with the patient and are negative.  PHYSICAL EXAMINATION: ECOG PERFORMANCE STATUS: 1 - Symptomatic but completely ambulatory  Vitals:   10/21/19 1103  BP: (!) 145/83  Pulse: 82  Resp: 18  Temp: 98.3 F (36.8 C)  SpO2: 100%   Filed Weights   10/21/19 1103  Weight: (!) 204 lb 6.4 oz (92.7 kg)    GENERAL:alert, no distress and comfortable NEURO: alert & oriented x 3 with fluent speech, no focal motor/sensory deficits  LABORATORY DATA:  I have reviewed the data as listed  No results found for: NA, K, CL, CO2, GLUCOSE, BUN, CREATININE, CALCIUM, PROT, ALBUMIN, AST, ALT, ALKPHOS, BILITOT, GFRNONAA, GFRAA  No  results found for: SPEP, UPEP  Lab Results  Component Value Date   WBC 8.5 09/13/2019   NEUTROABS 6.2 09/13/2019   HGB 9.5 (L) 09/13/2019   HCT 32.5 (L) 09/13/2019   MCV 75.9 (L) 09/13/2019   PLT 374 09/13/2019      Chemistry   No results found for: NA, K, CL, CO2, BUN, CREATININE, GLU No results found for: CALCIUM, ALKPHOS, AST, ALT, BILITOT

## 2019-10-21 NOTE — Patient Instructions (Signed)

## 2019-10-21 NOTE — Assessment & Plan Note (Signed)
She continues to have severe menorrhagia MRI of the pelvis confirmed uterine fibroids She is awaiting GYN appointment tomorrow  She is symptomatic from iron deficiency anemia The most likely cause of her anemia is due to chronic blood loss/malabsorption syndrome. We discussed some of the risks, benefits, and alternatives of intravenous iron infusions. The patient is symptomatic from anemia and the iron level is critically low. She tolerated oral iron supplement poorly and desires to achieved higher levels of iron faster for adequate hematopoesis. Some of the side-effects to be expected including risks of infusion reactions, phlebitis, headaches, nausea and fatigue.  The patient is willing to proceed. Patient education material was dispensed.  Goal is to keep ferritin level greater than 50 and resolution of anemia We will proceed with intravenous iron today in the next week I plan to see her back at the end of September for further follow-up

## 2019-10-22 ENCOUNTER — Encounter: Payer: Self-pay | Admitting: *Deleted

## 2019-10-22 ENCOUNTER — Ambulatory Visit
Admission: RE | Admit: 2019-10-22 | Discharge: 2019-10-22 | Disposition: A | Discharge status: Home / Self Care | Payer: BC Managed Care – PPO | Source: Ambulatory Visit | Attending: Interventional Radiology | Admitting: Interventional Radiology

## 2019-10-22 DIAGNOSIS — D259 Leiomyoma of uterus, unspecified: Secondary | ICD-10-CM

## 2019-10-22 NOTE — Progress Notes (Signed)
Chief Complaint: Symptomatic Uterine Fibroids    Referring Physician(s): Dr. Herma Carson (Ob-Gyn) Alvy Bimler (Hematology) Ninfa Linden (Surgery)   History of Present Illness: Terri Kirby is a 49 y.o.(G0, P0) female presenting today to [facility] as a scheduled follow up, to discuss her symptomatic uterine fibroids and her potential candidacy for treatment with uterine artery embolization.    Specifically, she has a personal interest in pursuing this treatment via a radial access and augmenting her treatment with superior hypogastric nerve block.    She joins [place] today by virtual visit, and I confirmed her identify using 2 personal identifiers.   Terri Kirby has recently spoken to my partner Dr. Pascal Lux in consultation 10/03/19, and he provided an excellent assessment and review of her current symptoms at this time.  She confirms that she has recently required a transfusion for anemia, and is scheduled to receive another infusion therapy in a few days.   She denies any other symptoms.   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    Today she reinforced her desire to be treated with minimally invasive technique, as she would very much like to avoid surgery.   MRI demonstrates a bulky myomatous uterus, with no pedunculated fibroids, but exophytic fibroids.     Past Medical History:  Diagnosis Date  . Anemia   . Iron deficiency anemia 11/13/2015    Past Surgical History:  Procedure Laterality Date  . IR RADIOLOGIST EVAL & MGMT  10/03/2019  . SKIN BIOPSY      Allergies: Patient has no known allergies.  Medications: Prior to Admission medications   Medication Sig Start Date End Date Taking? Authorizing Provider  albuterol (PROVENTIL HFA;VENTOLIN HFA) 108 (90 Base) MCG/ACT inhaler Inhale 1 puff into the lungs every 6 (six) hours as needed for wheezing or shortness of  breath.    [provider]  montelukast (SINGULAIR) 10 MG tablet Take 10 mg by mouth at bedtime.    [provider]  ondansetron (ZOFRAN) 8 MG tablet Take 8 mg by mouth 2 (two) times daily as needed. 09/17/19   [provider]  SYMBICORT 160-4.5 MCG/ACT inhaler Inhale 1 puff into the lungs 2 (two) times daily. 07/24/19   [provider]     No family history on file.  Social History   Socioeconomic History  . Marital status: Single    Spouse name: Not on file  . Number of children: 0  . Years of education: Not on file  . Highest education level: Not on file  Occupational History  . Occupation: Technical brewer: [employer]  Tobacco Use  . Smoking status: Never Smoker  . Smokeless tobacco: Never Used  Substance and Sexual Activity  . Alcohol use: Yes    Comment: socially  . Drug use: Not on file  . Sexual activity: Not on file  Other Topics Concern  . Not on file  Social History Narrative   Vegetarian since 40   Social Determinants of Health   Financial Resource Strain:   . Difficulty of Paying Living Expenses:   Food Insecurity:   . Worried About Charity fundraiser in the Last Year:   . Arboriculturist in the Last Year:   Transportation Needs:   . Film/video editor (Medical):   Marland Kitchen Lack of Transportation (Non-Medical):   Physical Activity:   .  Days of Exercise per Week:   . Minutes of Exercise per Session:   Stress:   . Feeling of Stress :   Social Connections:   . Frequency of Communication with Friends and Family:   . Frequency of Social Gatherings with Friends and Family:   . Attends Religious Services:   . Active Member of Clubs or Organizations:   . Attends Archivist Meetings:   Marland Kitchen Marital Status:      Review of Systems  Review of Systems: A 12 point ROS discussed and pertinent positives are indicated in the HPI above.  All other systems are negative.  Physical Exam No direct  physical exam was performed (except for noted visual exam findings with Video Visits).     Vital Signs: LMP 10/16/2019   Imaging: DG Chest 2 View  Result Date: 10/18/2019 CLINICAL DATA:  Asthma.  Shortness of breath and weakness. EXAM: CHEST - 2 VIEW COMPARISON:  None. FINDINGS: Normal sized heart. Clear lungs with normal vascularity. Minimal scoliosis. IMPRESSION: No acute abnormality. Electronically Signed   By: Claudie Revering M.D.   On: 10/18/2019 15:36   MR PELVIS W WO CONTRAST  Result Date: 10/17/2019 CLINICAL DATA:  49 year old female with a history of uterine fibroids, presenting for evaluation prior to uterine artery embolization. History of Feraheme injection 09/05/2019. EXAM: MRI PELVIS WITHOUT AND WITH CONTRAST TECHNIQUE: Multiplanar multisequence MR imaging of the pelvis was performed both before and after administration of intravenous contrast. CONTRAST:  73mL GADAVIST GADOBUTROL 1 MMOL/ML IV SOLN COMPARISON:  None. FINDINGS: Urinary Tract:  Normal bladder.  Normal urethra. Bowel: Visualized small and large bowel are normal caliber with no bowel wall thickening. Vascular/Lymphatic: No pathologically enlarged lymph nodes in the pelvis. No acute vascular abnormality. Reproductive: Uterus: The bulky myomatous anteverted uterus measures 18.4 x 10.9 x 13.7 cm (volume = 1440 cm^3). There are numerous (at least 12) enhancing uterine fibroids, with representative fibroids as follows: -exophytic right fundal 9.2 x 7.8 x 8.5 cm (volume = 320 cm^3) subserosal fibroid -left anterior uterine body 11.0 x 9.0 x 11.2 cm (volume = 580 cm^3) intramural fibroid -right posterior uterine body 2.6 x 1.8 x 2.1 cm (volume = 5.1 cm^3) submucosal fibroid No intracavitary or pedunculated fibroids. Inner myometrium (junctional zone) measures 7 mm in thickness, which is within normal limits. Endometrium measures 6 mm in bilayer thickness, which is within normal limits. No focal endometrial mass or endometrial cavity fluid.  Normal uterine cervix. Ovaries and Adnexa: The right ovary measures 3.9 x 1.4 x 1.3 cm and is normal. The left ovary is difficult to visualize given distortion from the enlarged myomatous uterus, estimated to measure 3.0 x 1.3 x 1.1 cm and appearing normal. There are no suspicious ovarian or adnexal masses. Other: No abnormal free fluid in the pelvis. No focal pelvic fluid collection. Musculoskeletal: No aggressive appearing focal osseous lesions. IMPRESSION: 1. Bulky myomatous uterus, as detailed. No intracavitary or pedunculated fibroids. 2. No suspicious ovarian or adnexal masses. Electronically Signed   By: Ilona Sorrel M.D.   On: 10/17/2019 12:14   IR Radiologist Eval & Mgmt  Result Date: 10/03/2019 Please refer to notes tab for details about interventional procedure. (Op Note)   Labs:  CBC: Recent Labs    08/19/19 1357 09/05/19 1348 09/13/19 1347 10/21/19 1033  WBC 8.2 9.0 8.5 6.5  HGB 6.8* 8.1* 9.5* 11.6*  HCT 24.6* 27.2* 32.5* 36.3  PLT 484* 278 374 296    COAGS: No results for input(s): INR,  APTT in the last 8760 hours.  BMP: No results for input(s): NA, K, CL, CO2, GLUCOSE, BUN, CALCIUM, CREATININE, GFRNONAA, GFRAA in the last 8760 hours.  Invalid input(s): CMP  LIVER FUNCTION TESTS: No results for input(s): BILITOT, AST, ALT, ALKPHOS, PROT, ALBUMIN in the last 8760 hours.  TUMOR MARKERS: No results for input(s): AFPTM, CEA, CA199, CHROMGRNA in the last 8760 hours.  Assessment and Plan:  Terri Jessop is a 49 yo female with symptomatic uterine fibroids, and is a candidate for uterine artery embolization.   I had a lengthy discussion with her regarding the anatomy, pathology, and treatment options for fibroids.  Specifically, I briefly discussed hormonal therapy, surgical hysterectomy/myomectomy, uterine fibroid embolization, and HIFU.   She is already aware of all of her options, as she clearly has been doing her own research.   Regarding UFE, we had a discussion  regarding the efficacy, expectations regarding outcomes/long term efficacy, and risk/benefit.   I shared with her a summary of the literature for UFE efficacy, which generally is accepted to be adequate symptom relief with good restoration of quality of life at 1 year in 90% or greater of patients for bleeding>pain> bulk symptoms.  I also told her that there is a cited rate of retreatment in ~15-20% of patients in the long term, with overall excellent long term utility of symptom relief and QOL.    We discussed the expectations not just for the treatment day/23 hour observation, but the first 3 months, 6 months - 12 months, and our follow up.   We specifically discussed superior hypogastric nerve block as adjunct to her treatment, which is useful in the first 24-48 hrs for pain control and reduce need for opioid/IV pain medication and for reducing nausea.   We also discussed radial artery access, and its utility with providing a means of early ambulation after procedure.  This would potentially allow her to avoid a foley catheter, of which she has interest.   Regarding risks, specific risks discussed include: post-embolization syndrome, bleeding, infection, contrast reaction, kidney/artery injury, need for further surgery/procedure, including hysterectomy, need for hospitalization, cardiopulmonary collapse, death.    Regarding post-embolization syndrome, I did let her know that this is essentially expected, with a typical prodromal syndrome lasting 4-7 days typically, and usually treated with medication/support such as hydration, rest, analgesics, PO nausea medications, and stool softeners.   After discussing, she would like to proceed with treatment. Her intention is to be treated before her surgery with Dr. Ninfa Linden, and I let her know that nothing that we would provide would be an obstacle for scheduling her breast surgery.    Plan: - plan tentatively to proceed with uterine artery embolization at  first available, with Dr. Earleen Newport at [facility], likely via trans-radial approach with superior hypogastric nerve block.    Electronically Signed: Corrie Mckusick 10/22/2019, 12:14 PM   I spent a total of    25 Minutes in remote  clinical consultation, greater than 50% of which was counseling/coordinating care for symptomatic uterine fibroids, possible uterine artery embolization.    Visit type: Audio and video (webex).   Alternative for in-person consultation at [facility], [address], [place], [place]. This visit type was conducted due to national recommendations for restrictions regarding the COVID-19 Pandemic (e.g. social distancing).  This format is felt to be most appropriate for this patient at this time.  All issues noted in this document were discussed and addressed.

## 2019-10-25 ENCOUNTER — Other Ambulatory Visit (HOSPITAL_COMMUNITY): Payer: Self-pay | Admitting: Interventional Radiology

## 2019-10-25 DIAGNOSIS — D259 Leiomyoma of uterus, unspecified: Secondary | ICD-10-CM

## 2019-10-28 ENCOUNTER — Other Ambulatory Visit: Payer: Self-pay

## 2019-10-28 ENCOUNTER — Inpatient Hospital Stay: Payer: BC Managed Care – PPO | Attending: Hematology and Oncology

## 2019-10-28 VITALS — BP 145/84 | HR 75 | Temp 98.1°F | Resp 18

## 2019-10-28 DIAGNOSIS — D509 Iron deficiency anemia, unspecified: Secondary | ICD-10-CM | POA: Insufficient documentation

## 2019-10-28 DIAGNOSIS — D508 Other iron deficiency anemias: Secondary | ICD-10-CM

## 2019-10-28 MED ORDER — SODIUM CHLORIDE 0.9 % IV SOLN
Freq: Once | INTRAVENOUS | Status: AC
  Filled 2019-10-28: qty 250

## 2019-10-28 MED ORDER — SODIUM CHLORIDE 0.9 % IV SOLN
510.0000 mg | Freq: Once | INTRAVENOUS | Status: AC
  Administered 2019-10-28: 510 mg via INTRAVENOUS
  Filled 2019-10-28: qty 510

## 2019-10-28 NOTE — Patient Instructions (Signed)

## 2019-10-29 ENCOUNTER — Telehealth: Payer: Self-pay

## 2019-10-29 NOTE — Telephone Encounter (Signed)
Called and told her letter is ready for pickup for work. Faxed to HR to April Tibbs at [phone] per her instructions, received confirmation. She will pick up original letter tomorrow. Letter left out front per her request.

## 2019-11-06 ENCOUNTER — Other Ambulatory Visit: Payer: Self-pay | Admitting: Surgery

## 2019-11-06 DIAGNOSIS — D241 Benign neoplasm of right breast: Secondary | ICD-10-CM

## 2019-11-12 ENCOUNTER — Other Ambulatory Visit (HOSPITAL_COMMUNITY)
Admission: RE | Admit: 2019-11-12 | Discharge: 2019-11-12 | Disposition: A | Discharge status: Home / Self Care | Payer: BC Managed Care – PPO | Source: Ambulatory Visit | Attending: Interventional Radiology | Admitting: Interventional Radiology

## 2019-11-12 DIAGNOSIS — Z20822 Contact with and (suspected) exposure to covid-19: Secondary | ICD-10-CM | POA: Insufficient documentation

## 2019-11-12 DIAGNOSIS — Z01812 Encounter for preprocedural laboratory examination: Secondary | ICD-10-CM | POA: Insufficient documentation

## 2019-11-12 LAB — SARS CORONAVIRUS 2 (TAT 6-24 HRS): SARS Coronavirus 2: NEGATIVE

## 2019-11-13 ENCOUNTER — Other Ambulatory Visit (HOSPITAL_COMMUNITY): Payer: Self-pay | Admitting: Radiology

## 2019-11-13 ENCOUNTER — Other Ambulatory Visit: Payer: Self-pay | Admitting: Radiology

## 2019-11-14 ENCOUNTER — Ambulatory Visit (HOSPITAL_COMMUNITY)
Admission: RE | Admit: 2019-11-14 | Discharge: 2019-11-14 | Disposition: A | Discharge status: Home / Self Care | Payer: BC Managed Care – PPO | Source: Ambulatory Visit | Attending: Hematology and Oncology | Admitting: Hematology and Oncology

## 2019-11-14 ENCOUNTER — Encounter (HOSPITAL_COMMUNITY): Payer: Self-pay

## 2019-11-14 ENCOUNTER — Other Ambulatory Visit (HOSPITAL_COMMUNITY): Payer: Self-pay | Admitting: Interventional Radiology

## 2019-11-14 ENCOUNTER — Observation Stay (HOSPITAL_COMMUNITY)
Admission: RE | Admit: 2019-11-14 | Discharge: 2019-11-15 | Disposition: A | Discharge status: Home / Self Care | Payer: BC Managed Care – PPO | Source: Ambulatory Visit | Attending: Interventional Radiology | Admitting: Interventional Radiology

## 2019-11-14 ENCOUNTER — Other Ambulatory Visit: Payer: Self-pay

## 2019-11-14 DIAGNOSIS — D509 Iron deficiency anemia, unspecified: Secondary | ICD-10-CM | POA: Diagnosis not present

## 2019-11-14 DIAGNOSIS — D259 Leiomyoma of uterus, unspecified: Secondary | ICD-10-CM

## 2019-11-14 DIAGNOSIS — Z7951 Long term (current) use of inhaled steroids: Secondary | ICD-10-CM | POA: Insufficient documentation

## 2019-11-14 DIAGNOSIS — Z79899 Other long term (current) drug therapy: Secondary | ICD-10-CM | POA: Insufficient documentation

## 2019-11-14 DIAGNOSIS — J45909 Unspecified asthma, uncomplicated: Secondary | ICD-10-CM | POA: Insufficient documentation

## 2019-11-14 DIAGNOSIS — D251 Intramural leiomyoma of uterus: Secondary | ICD-10-CM

## 2019-11-14 HISTORY — PX: IR ANGIOGRAM PELVIS SELECTIVE OR SUPRASELECTIVE: IMG661

## 2019-11-14 HISTORY — PX: IR FLUORO GUIDED NEEDLE PLC ASPIRATION/INJECTION LOC: IMG2395

## 2019-11-14 HISTORY — PX: IR EMBO TUMOR ORGAN ISCHEMIA INFARCT INC GUIDE ROADMAPPING: IMG5449

## 2019-11-14 HISTORY — PX: IR US GUIDE VASC ACCESS RIGHT: IMG2390

## 2019-11-14 HISTORY — PX: IR ANGIOGRAM SELECTIVE EACH ADDITIONAL VESSEL: IMG667

## 2019-11-14 LAB — BASIC METABOLIC PANEL
Anion gap: 9 (ref 5–15)
BUN: 9 mg/dL (ref 6–20)
CO2: 24 mmol/L (ref 22–32)
Calcium: 9 mg/dL (ref 8.9–10.3)
Chloride: 105 mmol/L (ref 98–111)
Creatinine, Ser: 1.04 mg/dL — ABNORMAL HIGH (ref 0.44–1.00)
GFR calc Af Amer: >60 mL/min (ref >60)
GFR calc non Af Amer: >60 mL/min (ref >60)
Glucose, Bld: 85 mg/dL (ref 70–99)
Potassium: 4 mmol/L (ref 3.5–5.1)
Sodium: 138 mmol/L (ref 135–145)

## 2019-11-14 LAB — CBC WITH DIFFERENTIAL/PLATELET
Abs Immature Granulocytes: 0.03 10*3/uL (ref 0.00–0.07)
Basophils Absolute: 0.1 10*3/uL (ref 0.0–0.1)
Basophils Relative: 1 %
Eosinophils Absolute: 0.3 10*3/uL (ref 0.0–0.5)
Eosinophils Relative: 4 %
HCT: 40 % (ref 36.0–46.0)
Hemoglobin: 12.9 g/dL (ref 12.0–15.0)
Immature Granulocytes: 0 %
Lymphocytes Relative: 19 %
Lymphs Abs: 1.6 10*3/uL (ref 0.7–4.0)
MCH: 28.7 pg (ref 26.0–34.0)
MCHC: 32.3 g/dL (ref 30.0–36.0)
MCV: 89.1 fL (ref 80.0–100.0)
Monocytes Absolute: 0.6 10*3/uL (ref 0.1–1.0)
Monocytes Relative: 7 %
Neutro Abs: 5.8 10*3/uL (ref 1.7–7.7)
Neutrophils Relative %: 69 %
Platelets: 283 10*3/uL (ref 150–400)
RBC: 4.49 MIL/uL (ref 3.87–5.11)
RDW: 18.6 % — ABNORMAL HIGH (ref 11.5–15.5)
WBC: 8.4 10*3/uL (ref 4.0–10.5)
nRBC: 0 % (ref 0.0–0.2)

## 2019-11-14 LAB — PROTIME-INR
INR: 1 (ref 0.8–1.2)
Prothrombin Time: 12.3 seconds (ref 11.4–15.2)

## 2019-11-14 LAB — HCG, SERUM, QUALITATIVE: Preg, Serum: NEGATIVE

## 2019-11-14 MED ORDER — ONDANSETRON HCL 4 MG/2ML IJ SOLN
INTRAMUSCULAR | Status: AC
  Filled 2019-11-14: qty 2

## 2019-11-14 MED ORDER — LIDOCAINE HCL (PF) 1 % IJ SOLN
INTRAMUSCULAR | Status: DC | PRN
  Administered 2019-11-14 (×2): 5 mL

## 2019-11-14 MED ORDER — SODIUM CHLORIDE 0.9 % IV SOLN: INTRAVENOUS | Status: DC

## 2019-11-14 MED ORDER — ALBUTEROL SULFATE (2.5 MG/3ML) 0.083% IN NEBU: 3.0000 mL | INHALATION_SOLUTION | Freq: Four times a day (QID) | RESPIRATORY_TRACT | Status: DC | PRN

## 2019-11-14 MED ORDER — SODIUM CHLORIDE 0.9 % IV SOLN: 250.0000 mL | INTRAVENOUS | Status: DC | PRN

## 2019-11-14 MED ORDER — SODIUM CHLORIDE 0.9 % IV SOLN: INTRAVENOUS | Status: AC

## 2019-11-14 MED ORDER — KETOROLAC TROMETHAMINE 30 MG/ML IJ SOLN
INTRAMUSCULAR | Status: AC
  Administered 2019-11-14: 30 mg via INTRAVENOUS
  Filled 2019-11-14: qty 1

## 2019-11-14 MED ORDER — SODIUM CHLORIDE 0.9% FLUSH: 3.0000 mL | Freq: Two times a day (BID) | INTRAVENOUS | Status: DC

## 2019-11-14 MED ORDER — PROMETHAZINE HCL 25 MG/ML IJ SOLN
25.0000 mg | INTRAMUSCULAR | Status: DC | PRN
  Filled 2019-11-14: qty 1

## 2019-11-14 MED ORDER — LIDOCAINE HCL 1 % IJ SOLN
INTRAMUSCULAR | Status: AC
  Filled 2019-11-14: qty 20

## 2019-11-14 MED ORDER — ROPIVACAINE HCL 5 MG/ML IJ SOLN
INTRAMUSCULAR | Status: AC
  Filled 2019-11-14: qty 30

## 2019-11-14 MED ORDER — MIDAZOLAM HCL 2 MG/2ML IJ SOLN
INTRAMUSCULAR | Status: AC
  Filled 2019-11-14: qty 6

## 2019-11-14 MED ORDER — SODIUM CHLORIDE 0.9% FLUSH: 3.0000 mL | INTRAVENOUS | Status: DC | PRN

## 2019-11-14 MED ORDER — KETOROLAC TROMETHAMINE 30 MG/ML IJ SOLN: 30.0000 mg | Freq: Once | INTRAMUSCULAR | Status: DC

## 2019-11-14 MED ORDER — IOHEXOL 300 MG/ML  SOLN
100.0000 mL | Freq: Once | INTRAMUSCULAR | Status: AC | PRN
  Administered 2019-11-14: 25 mL via INTRA_ARTERIAL

## 2019-11-14 MED ORDER — MOMETASONE FURO-FORMOTEROL FUM 200-5 MCG/ACT IN AERO
2.0000 | INHALATION_SPRAY | Freq: Two times a day (BID) | RESPIRATORY_TRACT | Status: DC
  Administered 2019-11-14 – 2019-11-15 (×2): 2 via RESPIRATORY_TRACT
  Filled 2019-11-14: qty 8.8

## 2019-11-14 MED ORDER — KETOROLAC TROMETHAMINE 30 MG/ML IJ SOLN: 30.0000 mg | Freq: Once | INTRAMUSCULAR | Status: AC

## 2019-11-14 MED ORDER — ONDANSETRON HCL 4 MG/2ML IJ SOLN: 4.0000 mg | Freq: Four times a day (QID) | INTRAMUSCULAR | Status: DC | PRN

## 2019-11-14 MED ORDER — ONDANSETRON HCL 4 MG/2ML IJ SOLN
4.0000 mg | Freq: Four times a day (QID) | INTRAMUSCULAR | Status: DC
  Administered 2019-11-14 – 2019-11-15 (×3): 4 mg via INTRAVENOUS
  Filled 2019-11-14 (×3): qty 2

## 2019-11-14 MED ORDER — KETOROLAC TROMETHAMINE 30 MG/ML IJ SOLN
30.0000 mg | Freq: Four times a day (QID) | INTRAMUSCULAR | Status: DC
  Administered 2019-11-14 – 2019-11-15 (×3): 30 mg via INTRAVENOUS
  Filled 2019-11-14 (×3): qty 1

## 2019-11-14 MED ORDER — FENTANYL CITRATE (PF) 100 MCG/2ML IJ SOLN
INTRAMUSCULAR | Status: DC | PRN
  Administered 2019-11-14 (×4): 50 ug via INTRAVENOUS

## 2019-11-14 MED ORDER — MIDAZOLAM HCL 2 MG/2ML IJ SOLN
INTRAMUSCULAR | Status: DC | PRN
  Administered 2019-11-14 (×4): 1 mg via INTRAVENOUS

## 2019-11-14 MED ORDER — SODIUM CHLORIDE 0.9% FLUSH: 9.0000 mL | INTRAVENOUS | Status: DC | PRN

## 2019-11-14 MED ORDER — FENTANYL CITRATE (PF) 100 MCG/2ML IJ SOLN
INTRAMUSCULAR | Status: AC
  Filled 2019-11-14: qty 4

## 2019-11-14 MED ORDER — HYDROMORPHONE 1 MG/ML IV SOLN
INTRAVENOUS | Status: DC
  Administered 2019-11-14: 30 mg via INTRAVENOUS
  Administered 2019-11-14: 1.2 mg via INTRAVENOUS
  Administered 2019-11-15: 1.8 mg via INTRAVENOUS
  Administered 2019-11-15: 0.6 mg via INTRAVENOUS
  Administered 2019-11-15: 1.2 mg via INTRAVENOUS
  Filled 2019-11-14 (×2): qty 30

## 2019-11-14 MED ORDER — DIPHENHYDRAMINE HCL 12.5 MG/5ML PO ELIX
12.5000 mg | ORAL_SOLUTION | Freq: Four times a day (QID) | ORAL | Status: DC | PRN
  Filled 2019-11-14: qty 5

## 2019-11-14 MED ORDER — IOHEXOL 300 MG/ML  SOLN
100.0000 mL | Freq: Once | INTRAMUSCULAR | Status: AC | PRN
  Administered 2019-11-14: 60 mL via INTRA_ARTERIAL

## 2019-11-14 MED ORDER — DOCUSATE SODIUM 100 MG PO CAPS
100.0000 mg | ORAL_CAPSULE | Freq: Two times a day (BID) | ORAL | Status: DC
  Administered 2019-11-14 – 2019-11-15 (×2): 100 mg via ORAL
  Filled 2019-11-14 (×4): qty 1

## 2019-11-14 MED ORDER — DIPHENHYDRAMINE HCL 50 MG/ML IJ SOLN: 12.5000 mg | Freq: Four times a day (QID) | INTRAMUSCULAR | Status: DC | PRN

## 2019-11-14 MED ORDER — NALOXONE HCL 0.4 MG/ML IJ SOLN: 0.4000 mg | INTRAMUSCULAR | Status: DC | PRN

## 2019-11-14 MED ORDER — ONDANSETRON HCL 4 MG/2ML IJ SOLN
INTRAMUSCULAR | Status: DC | PRN
  Administered 2019-11-14: 4 mg via INTRAVENOUS

## 2019-11-14 NOTE — H&P (Signed)
Chief Complaint: Patient was seen in consultation today for uterine leiomyoma/bilateral uterine artery embolization.  Referring Physician(s): Christophe Louis  Supervising Physician: Corrie Mckusick  Patient Status: [facility] - Out-pt  History of Present Illness: Terri Kirby is a 49 y.o. female with a past medical history of asthma, uterine leiomyoma and iron deficiency anemia. She was referred to IR by her GYN for management of symptomatic uterine fibroids, causing heavy menorrhagia and iron deficiency anemia. She consulted with both Dr. Pascal Lux and Dr. Earleen Newport regarding management options. At that time, she decided to pursue uterine artery embolization as management.  Patient presents today for possible image-guided pelvic arteriogram with possible bilateral uterine artery embolization with possible superior hypogastric nerve block. Patient awake and alert sitting in bed with no complaints at this time. Denies fever, chills, chest pain, dyspnea, abdominal/pelvic pain, or headache.   Past Medical History:  Diagnosis Date  . Anemia   . Iron deficiency anemia 11/13/2015    Past Surgical History:  Procedure Laterality Date  . IR RADIOLOGIST EVAL & MGMT  10/03/2019  . IR RADIOLOGIST EVAL & MGMT  10/22/2019  . SKIN BIOPSY      Allergies: Patient has no known allergies.  Medications: Prior to Admission medications   Medication Sig Start Date End Date Taking? Authorizing Provider  albuterol (PROVENTIL HFA;VENTOLIN HFA) 108 (90 Base) MCG/ACT inhaler Inhale 1 puff into the lungs every 6 (six) hours as needed for wheezing or shortness of breath.   Yes [provider]  ondansetron (ZOFRAN) 8 MG tablet Take 8 mg by mouth 2 (two) times daily as needed. 09/17/19  Yes [provider]  SYMBICORT 160-4.5 MCG/ACT inhaler Inhale 1 puff into the lungs 2 (two) times daily. 07/24/19  Yes [provider]  montelukast (SINGULAIR) 10 MG tablet Take 10 mg by mouth at bedtime.     [provider]     History reviewed. No pertinent family history.  Social History   Socioeconomic History  . Marital status: Single    Spouse name: Not on file  . Number of children: 0  . Years of education: Not on file  . Highest education level: Not on file  Occupational History  . Occupation: Technical brewer: [employer]  Tobacco Use  . Smoking status: Never Smoker  . Smokeless tobacco: Never Used  Substance and Sexual Activity  . Alcohol use: Yes    Comment: socially  . Drug use: Not on file  . Sexual activity: Not on file  Other Topics Concern  . Not on file  Social History Narrative   Vegetarian since 22   Social Determinants of Health   Financial Resource Strain:   . Difficulty of Paying Living Expenses: Not on file  Food Insecurity:   . Worried About Charity fundraiser in the Last Year: Not on file  . Ran Out of Food in the Last Year: Not on file  Transportation Needs:   . Lack of Transportation (Medical): Not on file  . Lack of Transportation (Non-Medical): Not on file  Physical Activity:   . Days of Exercise per Week: Not on file  . Minutes of Exercise per Session: Not on file  Stress:   . Feeling of Stress : Not on file  Social Connections:   . Frequency of Communication with Friends and Family: Not on file  . Frequency of Social Gatherings with Friends and Family: Not on file  . Attends Religious Services: Not on file  .  Active Member of Clubs or Organizations: Not on file  . Attends Archivist Meetings: Not on file  . Marital Status: Not on file     Review of Systems: A 12 point ROS discussed and pertinent positives are indicated in the HPI above.  All other systems are negative.  Review of Systems  Constitutional: Negative for chills and fever.  Respiratory: Negative for shortness of breath and wheezing.   Cardiovascular: Negative for chest pain and palpitations.  Gastrointestinal: Negative for  abdominal pain.  Genitourinary: Negative for pelvic pain.  Neurological: Negative for headaches.  Psychiatric/Behavioral: Negative for behavioral problems and confusion.    Vital Signs: BP (!) 139/93 (BP Location: Right Arm)   Pulse (!) 119   Temp 98.5 F (36.9 C) (Oral)   Resp 18   LMP 10/16/2019   SpO2 100%   Physical Exam Vitals and nursing note reviewed.  Constitutional:      General: She is not in acute distress.    Appearance: Normal appearance.  Cardiovascular:     Rate and Rhythm: Normal rate and regular rhythm.     Heart sounds: Normal heart sounds. No murmur heard.   Pulmonary:     Effort: Pulmonary effort is normal. No respiratory distress.     Breath sounds: Normal breath sounds. No wheezing.  Skin:    General: Skin is warm and dry.  Neurological:     Mental Status: She is alert and oriented to person, place, and time.      MD Evaluation Airway: WNL Heart: WNL Abdomen: WNL Chest/ Lungs: WNL ASA  Classification: 2 Mallampati/Airway Score: One   Imaging: DG Chest 2 View  Result Date: 10/18/2019 CLINICAL DATA:  Asthma.  Shortness of breath and weakness. EXAM: CHEST - 2 VIEW COMPARISON:  None. FINDINGS: Normal sized heart. Clear lungs with normal vascularity. Minimal scoliosis. IMPRESSION: No acute abnormality. Electronically Signed   By: Claudie Revering M.D.   On: 10/18/2019 15:36   MR PELVIS W WO CONTRAST  Result Date: 10/17/2019 CLINICAL DATA:  49 year old female with a history of uterine fibroids, presenting for evaluation prior to uterine artery embolization. History of Feraheme injection 09/05/2019. EXAM: MRI PELVIS WITHOUT AND WITH CONTRAST TECHNIQUE: Multiplanar multisequence MR imaging of the pelvis was performed both before and after administration of intravenous contrast. CONTRAST:  22mL GADAVIST GADOBUTROL 1 MMOL/ML IV SOLN COMPARISON:  None. FINDINGS: Urinary Tract:  Normal bladder.  Normal urethra. Bowel: Visualized small and large bowel are normal  caliber with no bowel wall thickening. Vascular/Lymphatic: No pathologically enlarged lymph nodes in the pelvis. No acute vascular abnormality. Reproductive: Uterus: The bulky myomatous anteverted uterus measures 18.4 x 10.9 x 13.7 cm (volume = 1440 cm^3). There are numerous (at least 12) enhancing uterine fibroids, with representative fibroids as follows: -exophytic right fundal 9.2 x 7.8 x 8.5 cm (volume = 320 cm^3) subserosal fibroid -left anterior uterine body 11.0 x 9.0 x 11.2 cm (volume = 580 cm^3) intramural fibroid -right posterior uterine body 2.6 x 1.8 x 2.1 cm (volume = 5.1 cm^3) submucosal fibroid No intracavitary or pedunculated fibroids. Inner myometrium (junctional zone) measures 7 mm in thickness, which is within normal limits. Endometrium measures 6 mm in bilayer thickness, which is within normal limits. No focal endometrial mass or endometrial cavity fluid. Normal uterine cervix. Ovaries and Adnexa: The right ovary measures 3.9 x 1.4 x 1.3 cm and is normal. The left ovary is difficult to visualize given distortion from the enlarged myomatous uterus, estimated to measure 3.0  x 1.3 x 1.1 cm and appearing normal. There are no suspicious ovarian or adnexal masses. Other: No abnormal free fluid in the pelvis. No focal pelvic fluid collection. Musculoskeletal: No aggressive appearing focal osseous lesions. IMPRESSION: 1. Bulky myomatous uterus, as detailed. No intracavitary or pedunculated fibroids. 2. No suspicious ovarian or adnexal masses. Electronically Signed   By: Ilona Sorrel M.D.   On: 10/17/2019 12:14   IR Radiologist Eval & Mgmt  Result Date: 10/22/2019 Please refer to notes tab for details about interventional procedure. (Op Note)   Labs:  CBC: Recent Labs    09/05/19 1348 09/13/19 1347 10/21/19 1033 11/14/19 1246  WBC 9.0 8.5 6.5 8.4  HGB 8.1* 9.5* 11.6* 12.9  HCT 27.2* 32.5* 36.3 40.0  PLT 278 374 296 283    COAGS: Recent Labs    11/14/19 1246  INR 1.0     BMP: No results for input(s): NA, K, CL, CO2, GLUCOSE, BUN, CALCIUM, CREATININE, GFRNONAA, GFRAA in the last 8760 hours.  Invalid input(s): CMP   Assessment and Plan:  Symptomatic uterine leiomyoma. Plan for image-guided pelvic arteriogram with possible bilateral uterine artery embolization with possible superior hypogastric nerve block today in IR. Patient will be admitted for overnight observation/pain control following procedure. Patient is NPO. Afebrile and WBCs WNL. She does not take blood thinners. INR 1.0 today.  The risks and benefits of uterine artery embolization were discussed with the patient including, but not limited to bleeding, infection, vascular injury, post operative pain, or contrast induced renal failure. This procedure involves the use of X-rays and because of the nature of the planned procedure, it is possible that we will have prolonged use of X-ray fluoroscopy. Potential radiation risks to you include (but are not limited to) the following: - A slightly elevated risk for cancer several years later in life. This risk is typically less than 0.5% percent. This risk is low in comparison to the normal incidence of human cancer, which is 33% for women and 50% for men according to the [employer]. - Radiation induced injury can include skin redness, resembling a rash, tissue breakdown / ulcers and hair loss (which can be temporary or permanent).  The likelihood of either of these occurring depends on the difficulty of the procedure and whether you are sensitive to radiation due to previous procedures, disease, or genetic conditions.  IF your procedure requires a prolonged use of radiation, you will be notified and given written instructions for further action.  It is your responsibility to monitor the irradiated area for the 2 weeks following the procedure and to notify your physician if you are concerned that you have suffered a radiation induced injury.    All of the patient's questions were answered, patient is agreeable to proceed. Consent signed and in chart.   Thank you for this interesting consult.  I greatly enjoyed meeting JESSA STINSON and look forward to participating in their care.  A copy of this report was sent to the requesting provider on this date.  Electronically Signed: Earley Abide, PA-C 11/14/2019, 1:15 PM   I spent a total of 40 Minutes in face to face in clinical consultation, greater than 50% of which was counseling/coordinating care for uterine leiomyoma/bilateral uterine artery embolization.

## 2019-11-14 NOTE — Procedures (Signed)
Interventional Radiology Procedure Note  Procedure:   US guided right CFA access for pelvic angiogram and bilateral uterine artery embolization.  Superior hypogastric nerve block Exoseal for hemostasis  Findings: Left radial artery 1.66m. Left ulnar artery ~130mCFA patent Large fibroid uterus.  Bilateral UA's embo'd to 5beat stasis  Complications: None  Recommendations:  - Bedrest with right hip straight x 4 hours - bedrest until patient would like to ambulate.  If at 9pm she would like foley out, may remove. Otherwise, remove foley at 6am - PCA, dilaudid - Q 6 hr toradol - Scheduled zofran - break through promethazine - ice/heat prn - iv fluid hydration - observation overnight - routine wound care - DC tomorrow when goals met  Signed,  JaDulcy FannyWaEarleen NewportDO

## 2019-11-15 DIAGNOSIS — D259 Leiomyoma of uterus, unspecified: Secondary | ICD-10-CM | POA: Diagnosis not present

## 2019-11-15 MED ORDER — HYDROCODONE-ACETAMINOPHEN 5-325 MG PO TABS
1.0000 | ORAL_TABLET | Freq: Four times a day (QID) | ORAL | 0 refills | Status: DC | PRN
Start: 2019-11-15 — End: 2019-12-05

## 2019-11-15 MED ORDER — ONDANSETRON HCL 4 MG PO TABS: 4.0000 mg | ORAL_TABLET | Freq: Three times a day (TID) | ORAL | 0 refills | Status: DC | PRN

## 2019-11-15 MED ORDER — IBUPROFEN 800 MG PO TABS: 800.0000 mg | ORAL_TABLET | Freq: Four times a day (QID) | ORAL | Status: DC | PRN

## 2019-11-15 MED ORDER — IBUPROFEN 200 MG PO TABS: 800.0000 mg | ORAL_TABLET | Freq: Three times a day (TID) | ORAL | 0 refills | Status: DC | PRN

## 2019-11-15 MED ORDER — HYDROCODONE-ACETAMINOPHEN 5-325 MG PO TABS
1.0000 | ORAL_TABLET | ORAL | Status: DC | PRN
  Administered 2019-11-15: 2 via ORAL
  Filled 2019-11-15: qty 2

## 2019-11-15 MED ORDER — DULCOLAX 5 MG PO TBEC: 5.0000 mg | DELAYED_RELEASE_TABLET | Freq: Every day | ORAL | 0 refills | Status: DC | PRN

## 2019-11-15 MED ORDER — ACETAMINOPHEN 500 MG PO TABS: 500.0000 mg | ORAL_TABLET | ORAL | Status: DC | PRN

## 2019-11-15 NOTE — Discharge Instructions (Signed)
Uterine Artery Embolization for Fibroids, Care After °This sheet gives you information about how to care for yourself after your procedure. Your health care provider may also give you more specific instructions. If you have problems or questions, contact your health care provider. °What can I expect after the procedure? °After your procedure, it is common to have: °· Pelvic cramping. You will be given pain medicine. °· Nausea and vomiting. You may be given medicine to help relieve nausea. °Follow these instructions at home: °Incision care °· Follow instructions from your health care provider about how to take care of your incision. Make sure you: °? Wash your hands with soap and water before you change your bandage (dressing). If soap and water are not available, use hand sanitizer. °? Change your dressing as told by your health care provider. °· Check your incision area every day for signs of infection. Check for: °? More redness, swelling, or pain. °? More fluid or blood. °? Warmth. °? Pus or a bad smell. °Medicines ° °· Take over-the-counter and prescription medicines only as told by your health care provider. °· Do not take aspirin. It can cause bleeding. °· Do not drive for 24 hours if you were given a medicine to help you relax (sedative). °· Do not drive or use heavy machinery while taking prescription pain medicine. °General instructions °· Ask your health care provider when you can resume sexual activity. °· To prevent or treat constipation while you are taking prescription pain medicine, your health care provider may recommend that you: °? Drink enough fluid to keep your urine clear or pale yellow. °? Take over-the-counter or prescription medicines. °? Eat foods that are high in fiber, such as fresh fruits and vegetables, whole grains, and beans. °? Limit foods that are high in fat and processed sugars, such as fried and sweet foods. °Contact a health care provider if: °· You have a fever. °· You have more  redness, swelling, or pain around your incision site. °· You have more fluid or blood coming from your incision site. °· Your incision feels warm to the touch. °· You have pus or a bad smell coming from your incision. °· You have a rash. °· You have uncontrolled nausea or you cannot eat or drink anything without vomiting. °Get help right away if: °· You have trouble breathing. °· You have chest pain. °· You have severe abdominal pain. °· You have leg pain. °· You become dizzy and faint. °Summary °· After your procedure, it is common to have pelvic cramping. You will be given pain medicine. °· Follow instructions from your health care provider about how to take care of your incision. °· Check your incision area every day for signs of infection. °· Take over-the-counter and prescription medicines only as told by your health care provider. °This information is not intended to replace advice given to you by your health care provider. Make sure you discuss any questions you have with your health care provider. °Document Revised: 02/24/2017 Document Reviewed: 06/16/2016 °Elsevier Patient Education © 2020 [employer] ° °

## 2019-11-15 NOTE — Discharge Summary (Signed)
Patient ID: Terri Kirby MRN: [id number] DOB/AGE: 06/24/1970 49 y.o.  Admit date: 11/14/2019 Discharge date: 11/15/2019  Supervising Physician: Corrie Mckusick  Patient Status: [facility] - In-pt  Admission Diagnoses: Uterine leiomyoma  Discharge Diagnoses:  Active Problems:   Uterine leiomyoma   Fibroid uterus   Discharged Condition: good  Hospital Course: Patient underwent bilateral uterine artery embolization 11/14/19 with Dr. Earleen Newport. She was admitted for overnight observation. She did experience some post-procedure nausea, vomiting and pain. She had her foley catheter removed this morning and her PCA pump was discontinued. She has been able to eat some jello and crackers and drink some gingerale without difficulty. She has not required any additional pain or nausea medicine. Her mother is at the bedside with her and will be transporting her to [facility] for her recovery period. Discharge medications were e-prescribed to a [facility] in [place]. The patient was instructed to not submerge her groin site in water for 7 days and to notify us if the site becomes red, tender, or if there's excessive drainage. It's ok for her to shower. She knows that a scheduler from our office will call her to arrange a follow up visit with Dr. Earleen Newport in 2-4 weeks. She knows to call our office with any questions or concerns. I have instructed her bedside RN to administer one dose of pain medication and one dose of nausea medicine 30-45 minutes prior to discharge. The patient has a 3-4 hour drive home with her mother.   Consults: none  Significant Diagnostic Studies: DG Chest 2 View  Result Date: 10/18/2019 CLINICAL DATA:  Asthma.  Shortness of breath and weakness. EXAM: CHEST - 2 VIEW COMPARISON:  None. FINDINGS: Normal sized heart. Clear lungs with normal vascularity. Minimal scoliosis. IMPRESSION: No acute abnormality. Electronically Signed   By: Claudie Revering M.D.   On: 10/18/2019 15:36   MR PELVIS W WO  CONTRAST  Result Date: 10/17/2019 CLINICAL DATA:  49 year old female with a history of uterine fibroids, presenting for evaluation prior to uterine artery embolization. History of Feraheme injection 09/05/2019. EXAM: MRI PELVIS WITHOUT AND WITH CONTRAST TECHNIQUE: Multiplanar multisequence MR imaging of the pelvis was performed both before and after administration of intravenous contrast. CONTRAST:  77mL GADAVIST GADOBUTROL 1 MMOL/ML IV SOLN COMPARISON:  None. FINDINGS: Urinary Tract:  Normal bladder.  Normal urethra. Bowel: Visualized small and large bowel are normal caliber with no bowel wall thickening. Vascular/Lymphatic: No pathologically enlarged lymph nodes in the pelvis. No acute vascular abnormality. Reproductive: Uterus: The bulky myomatous anteverted uterus measures 18.4 x 10.9 x 13.7 cm (volume = 1440 cm^3). There are numerous (at least 12) enhancing uterine fibroids, with representative fibroids as follows: -exophytic right fundal 9.2 x 7.8 x 8.5 cm (volume = 320 cm^3) subserosal fibroid -left anterior uterine body 11.0 x 9.0 x 11.2 cm (volume = 580 cm^3) intramural fibroid -right posterior uterine body 2.6 x 1.8 x 2.1 cm (volume = 5.1 cm^3) submucosal fibroid No intracavitary or pedunculated fibroids. Inner myometrium (junctional zone) measures 7 mm in thickness, which is within normal limits. Endometrium measures 6 mm in bilayer thickness, which is within normal limits. No focal endometrial mass or endometrial cavity fluid. Normal uterine cervix. Ovaries and Adnexa: The right ovary measures 3.9 x 1.4 x 1.3 cm and is normal. The left ovary is difficult to visualize given distortion from the enlarged myomatous uterus, estimated to measure 3.0 x 1.3 x 1.1 cm and appearing normal. There are no suspicious ovarian or adnexal masses. Other: No  abnormal free fluid in the pelvis. No focal pelvic fluid collection. Musculoskeletal: No aggressive appearing focal osseous lesions. IMPRESSION: 1. Bulky myomatous  uterus, as detailed. No intracavitary or pedunculated fibroids. 2. No suspicious ovarian or adnexal masses. Electronically Signed   By: Ilona Sorrel M.D.   On: 10/17/2019 12:14   IR Angiogram Pelvis Selective Or Supraselective  Result Date: 11/14/2019 INDICATION: 49 year old female presents for treatment of symptomatic uterine fibroids with embolization. The patient requested radial artery approach and a superior hypogastric nerve block. Ultrasound of the left wrist confirms a left radial artery that is 1.7 mm and the ulnar artery that is less than 1 mm. We deferred radial approach for safety. EXAM: ULTRASOUND-GUIDED ACCESS RIGHT COMMON FEMORAL ARTERY PELVIC ANGIOGRAM BILATERAL UTERINE ARTERY EMBOLIZATION IMAGE GUIDED NEEDLE PLACEMENT FOR SUPERIOR HYPOGASTRIC NERVE BLOCK. EXOSEAL FOR HEMOSTASIS MEDICATIONS: 4 mg Zofran, 30 mg IV Toradol ANESTHESIA/SEDATION: Moderate (conscious) sedation was employed during this procedure. A total of Versed 4.0 mg and Fentanyl 200 mcg was administered intravenously. Moderate Sedation Time: 70 minutes. The patient's level of consciousness and vital signs were monitored continuously by radiology nursing throughout the procedure under my direct supervision. CONTRAST:  85 cc Omni 300 FLUOROSCOPY TIME:  Fluoroscopy Time: 19 minutes 18 seconds (2,994 mGy). COMPLICATIONS: None PROCEDURE: The procedure, risks, benefits, and alternatives were explained to the patient. Questions regarding the procedure were encouraged and answered. The patient understands and consents to the procedure. The right groin was prepped with Betadine in a sterile fashion, and a sterile drape was applied covering the operative field. A sterile gown and sterile gloves were used for the procedure. Local anesthesia was provided with 1% Lidocaine. Before starting, ultrasound was performed of the left wrist. Radial artery was documented 1.7 mm, with ulnar artery even smaller. We deferred for femoral access. Ultrasound  survey of the right inguinal region was performed with images stored and sent to PACs. The skin and subcutaneous tissue generously infiltrated with 1% lidocaine for local anesthesia, and a micropuncture needle was advanced under ultrasound guidance into the right common femoral artery. With excellent blood flow returned, micro wire was advanced. The needle was removed and a micropuncture set was advanced over the micro wire. The inner dilator in the stylet were removed, and an 035 Bentson wire was advanced into the abdominal aorta. The micro sheath was removed, a 5 [place] vascular sheath was placed into the common femoral artery. The dilator was removed and the sheath was flushed. A 5 French Cobra catheter was advanced over the Bentson wire and used to select the left common iliac artery. The Cobra catheter was advanced into the internal iliac artery. Once the catheter was over the bifurcation of the aorta, a superior hypogastric nerve block was performed via an anterior approach by directing a 22 gauge, 15 cm Chiba needle onto the anterior surface of L5 below the bifurcation of the aorta. Once the surface the needle was in contact with the vertebral body, the stylet was removed, small amount of contrast confirmed a prevertebral needle location, and a solution of contrast and ~20cc 0.5% ropivacaine was infused with fluoroscopy. Approximately 20 cc of ropivacaine was administered. The needle was removed. Angiogram of the left hypogastric artery was performed for identification of the left uterine artery. Once we identified the origin, a high-flow Renegade micro catheter was advanced to the Cobra catheter with a 0.014 Fathom wire. The micro catheter and micro wire combination were used to select the uterine artery. Angiogram was performed. Once we confirmed  position the micro catheter within the distal descending left uterine artery, embolization was performed. Embolization of the left uterine artery was performed  with 2 vial of embospheres (500 - 700 microm). Embolization was performed to 5 beat stasis. The the micro wire/micro catheter were removed, and the Cobra catheter and Bentson wire were used to form a Waltman loop. The Waltman loop was then used to select the ipsilateral hypogastric artery. Angiogram was performed to confirm position and identified the origin of the urine artery. Once we confirmed the origin, the same micro catheter and micro wire combination were used to select the uterine artery. The catheter was advanced to the distal descending right uterine artery. Angiogram was performed to confirm position. Embolization of the right uterine artery was performed with 3.5 vials of 500 -700 micro m embospheres. Five beat stasis was achieved. The Waltman loop was reduced via the left common iliac artery with the assistance of the Bentson wire, and then the catheter and wire were removed. Angiogram of the right common femoral artery was performed. An Exoseal device was deployed for hemostasis. The patient tolerated the procedure well and remained hemodynamically stable throughout. No complications were encountered and no significant blood loss was encountered. IMPRESSION: Status post ultrasound guided access right common femoral artery for pelvic angiogram and bilateral uterine artery embolization, for treatment of symptomatic uterine fibroids, augmented by image guided superior hypogastric nerve block. ExoSeal deployed for hemostasis. Signed, Dulcy Fanny. Dellia Nims, RPVI [facility] Radiology Electronically Signed   By: Corrie Mckusick D.O.   On: 11/14/2019 17:09   IR Angiogram Selective Each Additional Vessel  Result Date: 11/14/2019 INDICATION: 49 year old female presents for treatment of symptomatic uterine fibroids with embolization. The patient requested radial artery approach and a superior hypogastric nerve block. Ultrasound of the left wrist confirms a left  radial artery that is 1.7 mm and the ulnar artery that is less than 1 mm. We deferred radial approach for safety. EXAM: ULTRASOUND-GUIDED ACCESS RIGHT COMMON FEMORAL ARTERY PELVIC ANGIOGRAM BILATERAL UTERINE ARTERY EMBOLIZATION IMAGE GUIDED NEEDLE PLACEMENT FOR SUPERIOR HYPOGASTRIC NERVE BLOCK. EXOSEAL FOR HEMOSTASIS MEDICATIONS: 4 mg Zofran, 30 mg IV Toradol ANESTHESIA/SEDATION: Moderate (conscious) sedation was employed during this procedure. A total of Versed 4.0 mg and Fentanyl 200 mcg was administered intravenously. Moderate Sedation Time: 70 minutes. The patient's level of consciousness and vital signs were monitored continuously by radiology nursing throughout the procedure under my direct supervision. CONTRAST:  85 cc Omni 300 FLUOROSCOPY TIME:  Fluoroscopy Time: 19 minutes 18 seconds (2,994 mGy). COMPLICATIONS: None PROCEDURE: The procedure, risks, benefits, and alternatives were explained to the patient. Questions regarding the procedure were encouraged and answered. The patient understands and consents to the procedure. The right groin was prepped with Betadine in a sterile fashion, and a sterile drape was applied covering the operative field. A sterile gown and sterile gloves were used for the procedure. Local anesthesia was provided with 1% Lidocaine. Before starting, ultrasound was performed of the left wrist. Radial artery was documented 1.7 mm, with ulnar artery even smaller. We deferred for femoral access. Ultrasound survey of the right inguinal region was performed with images stored and sent to PACs. The skin and subcutaneous tissue generously infiltrated with 1% lidocaine for local anesthesia, and a micropuncture needle was advanced under ultrasound guidance into the right common femoral artery. With excellent blood flow returned, micro wire was advanced. The needle was removed and a micropuncture set was advanced over the micro wire. The inner  dilator in the stylet were removed, and an 035  Bentson wire was advanced into the abdominal aorta. The micro sheath was removed, a 5 [place] vascular sheath was placed into the common femoral artery. The dilator was removed and the sheath was flushed. A 5 French Cobra catheter was advanced over the Bentson wire and used to select the left common iliac artery. The Cobra catheter was advanced into the internal iliac artery. Once the catheter was over the bifurcation of the aorta, a superior hypogastric nerve block was performed via an anterior approach by directing a 22 gauge, 15 cm Chiba needle onto the anterior surface of L5 below the bifurcation of the aorta. Once the surface the needle was in contact with the vertebral body, the stylet was removed, small amount of contrast confirmed a prevertebral needle location, and a solution of contrast and ~20cc 0.5% ropivacaine was infused with fluoroscopy. Approximately 20 cc of ropivacaine was administered. The needle was removed. Angiogram of the left hypogastric artery was performed for identification of the left uterine artery. Once we identified the origin, a high-flow Renegade micro catheter was advanced to the Cobra catheter with a 0.014 Fathom wire. The micro catheter and micro wire combination were used to select the uterine artery. Angiogram was performed. Once we confirmed position the micro catheter within the distal descending left uterine artery, embolization was performed. Embolization of the left uterine artery was performed with 2 vial of embospheres (500 - 700 microm). Embolization was performed to 5 beat stasis. The the micro wire/micro catheter were removed, and the Cobra catheter and Bentson wire were used to form a Waltman loop. The Waltman loop was then used to select the ipsilateral hypogastric artery. Angiogram was performed to confirm position and identified the origin of the urine artery. Once we confirmed the origin, the same micro catheter and micro wire combination were used to select the  uterine artery. The catheter was advanced to the distal descending right uterine artery. Angiogram was performed to confirm position. Embolization of the right uterine artery was performed with 3.5 vials of 500 -700 micro m embospheres. Five beat stasis was achieved. The Waltman loop was reduced via the left common iliac artery with the assistance of the Bentson wire, and then the catheter and wire were removed. Angiogram of the right common femoral artery was performed. An Exoseal device was deployed for hemostasis. The patient tolerated the procedure well and remained hemodynamically stable throughout. No complications were encountered and no significant blood loss was encountered. IMPRESSION: Status post ultrasound guided access right common femoral artery for pelvic angiogram and bilateral uterine artery embolization, for treatment of symptomatic uterine fibroids, augmented by image guided superior hypogastric nerve block. ExoSeal deployed for hemostasis. Signed, Dulcy Fanny. Dellia Nims, RPVI [facility] Radiology Electronically Signed   By: Corrie Mckusick D.O.   On: 11/14/2019 17:09   IR Angiogram Selective Each Additional Vessel  Result Date: 11/14/2019 INDICATION: 49 year old female presents for treatment of symptomatic uterine fibroids with embolization. The patient requested radial artery approach and a superior hypogastric nerve block. Ultrasound of the left wrist confirms a left radial artery that is 1.7 mm and the ulnar artery that is less than 1 mm. We deferred radial approach for safety. EXAM: ULTRASOUND-GUIDED ACCESS RIGHT COMMON FEMORAL ARTERY PELVIC ANGIOGRAM BILATERAL UTERINE ARTERY EMBOLIZATION IMAGE GUIDED NEEDLE PLACEMENT FOR SUPERIOR HYPOGASTRIC NERVE BLOCK. EXOSEAL FOR HEMOSTASIS MEDICATIONS: 4 mg Zofran, 30 mg IV Toradol ANESTHESIA/SEDATION: Moderate (conscious) sedation was employed during this  procedure. A total of Versed 4.0 mg and Fentanyl  200 mcg was administered intravenously. Moderate Sedation Time: 70 minutes. The patient's level of consciousness and vital signs were monitored continuously by radiology nursing throughout the procedure under my direct supervision. CONTRAST:  85 cc Omni 300 FLUOROSCOPY TIME:  Fluoroscopy Time: 19 minutes 18 seconds (2,994 mGy). COMPLICATIONS: None PROCEDURE: The procedure, risks, benefits, and alternatives were explained to the patient. Questions regarding the procedure were encouraged and answered. The patient understands and consents to the procedure. The right groin was prepped with Betadine in a sterile fashion, and a sterile drape was applied covering the operative field. A sterile gown and sterile gloves were used for the procedure. Local anesthesia was provided with 1% Lidocaine. Before starting, ultrasound was performed of the left wrist. Radial artery was documented 1.7 mm, with ulnar artery even smaller. We deferred for femoral access. Ultrasound survey of the right inguinal region was performed with images stored and sent to PACs. The skin and subcutaneous tissue generously infiltrated with 1% lidocaine for local anesthesia, and a micropuncture needle was advanced under ultrasound guidance into the right common femoral artery. With excellent blood flow returned, micro wire was advanced. The needle was removed and a micropuncture set was advanced over the micro wire. The inner dilator in the stylet were removed, and an 035 Bentson wire was advanced into the abdominal aorta. The micro sheath was removed, a 5 [place] vascular sheath was placed into the common femoral artery. The dilator was removed and the sheath was flushed. A 5 French Cobra catheter was advanced over the Bentson wire and used to select the left common iliac artery. The Cobra catheter was advanced into the internal iliac artery. Once the catheter was over the bifurcation of the aorta, a superior hypogastric nerve block was performed via an  anterior approach by directing a 22 gauge, 15 cm Chiba needle onto the anterior surface of L5 below the bifurcation of the aorta. Once the surface the needle was in contact with the vertebral body, the stylet was removed, small amount of contrast confirmed a prevertebral needle location, and a solution of contrast and ~20cc 0.5% ropivacaine was infused with fluoroscopy. Approximately 20 cc of ropivacaine was administered. The needle was removed. Angiogram of the left hypogastric artery was performed for identification of the left uterine artery. Once we identified the origin, a high-flow Renegade micro catheter was advanced to the Cobra catheter with a 0.014 Fathom wire. The micro catheter and micro wire combination were used to select the uterine artery. Angiogram was performed. Once we confirmed position the micro catheter within the distal descending left uterine artery, embolization was performed. Embolization of the left uterine artery was performed with 2 vial of embospheres (500 - 700 microm). Embolization was performed to 5 beat stasis. The the micro wire/micro catheter were removed, and the Cobra catheter and Bentson wire were used to form a Waltman loop. The Waltman loop was then used to select the ipsilateral hypogastric artery. Angiogram was performed to confirm position and identified the origin of the urine artery. Once we confirmed the origin, the same micro catheter and micro wire combination were used to select the uterine artery. The catheter was advanced to the distal descending right uterine artery. Angiogram was performed to confirm position. Embolization of the right uterine artery was performed with 3.5 vials of 500 -700 micro m embospheres. Five beat stasis was achieved. The Waltman loop was reduced via the left common iliac artery with the assistance  of the Bentson wire, and then the catheter and wire were removed. Angiogram of the right common femoral artery was performed. An Exoseal device  was deployed for hemostasis. The patient tolerated the procedure well and remained hemodynamically stable throughout. No complications were encountered and no significant blood loss was encountered. IMPRESSION: Status post ultrasound guided access right common femoral artery for pelvic angiogram and bilateral uterine artery embolization, for treatment of symptomatic uterine fibroids, augmented by image guided superior hypogastric nerve block. ExoSeal deployed for hemostasis. Signed, Dulcy Fanny. Dellia Nims, RPVI [facility] Radiology Electronically Signed   By: Corrie Mckusick D.O.   On: 11/14/2019 17:09   IR US Guide Vasc Access Right  Result Date: 11/14/2019 INDICATION: 49 year old female presents for treatment of symptomatic uterine fibroids with embolization. The patient requested radial artery approach and a superior hypogastric nerve block. Ultrasound of the left wrist confirms a left radial artery that is 1.7 mm and the ulnar artery that is less than 1 mm. We deferred radial approach for safety. EXAM: ULTRASOUND-GUIDED ACCESS RIGHT COMMON FEMORAL ARTERY PELVIC ANGIOGRAM BILATERAL UTERINE ARTERY EMBOLIZATION IMAGE GUIDED NEEDLE PLACEMENT FOR SUPERIOR HYPOGASTRIC NERVE BLOCK. EXOSEAL FOR HEMOSTASIS MEDICATIONS: 4 mg Zofran, 30 mg IV Toradol ANESTHESIA/SEDATION: Moderate (conscious) sedation was employed during this procedure. A total of Versed 4.0 mg and Fentanyl 200 mcg was administered intravenously. Moderate Sedation Time: 70 minutes. The patient's level of consciousness and vital signs were monitored continuously by radiology nursing throughout the procedure under my direct supervision. CONTRAST:  85 cc Omni 300 FLUOROSCOPY TIME:  Fluoroscopy Time: 19 minutes 18 seconds (2,994 mGy). COMPLICATIONS: None PROCEDURE: The procedure, risks, benefits, and alternatives were explained to the patient. Questions regarding the procedure were encouraged and answered. The  patient understands and consents to the procedure. The right groin was prepped with Betadine in a sterile fashion, and a sterile drape was applied covering the operative field. A sterile gown and sterile gloves were used for the procedure. Local anesthesia was provided with 1% Lidocaine. Before starting, ultrasound was performed of the left wrist. Radial artery was documented 1.7 mm, with ulnar artery even smaller. We deferred for femoral access. Ultrasound survey of the right inguinal region was performed with images stored and sent to PACs. The skin and subcutaneous tissue generously infiltrated with 1% lidocaine for local anesthesia, and a micropuncture needle was advanced under ultrasound guidance into the right common femoral artery. With excellent blood flow returned, micro wire was advanced. The needle was removed and a micropuncture set was advanced over the micro wire. The inner dilator in the stylet were removed, and an 035 Bentson wire was advanced into the abdominal aorta. The micro sheath was removed, a 5 [place] vascular sheath was placed into the common femoral artery. The dilator was removed and the sheath was flushed. A 5 French Cobra catheter was advanced over the Bentson wire and used to select the left common iliac artery. The Cobra catheter was advanced into the internal iliac artery. Once the catheter was over the bifurcation of the aorta, a superior hypogastric nerve block was performed via an anterior approach by directing a 22 gauge, 15 cm Chiba needle onto the anterior surface of L5 below the bifurcation of the aorta. Once the surface the needle was in contact with the vertebral body, the stylet was removed, small amount of contrast confirmed a prevertebral needle location, and a solution of contrast and ~20cc 0.5% ropivacaine was infused with fluoroscopy. Approximately 20 cc of ropivacaine  was administered. The needle was removed. Angiogram of the left hypogastric artery was performed for  identification of the left uterine artery. Once we identified the origin, a high-flow Renegade micro catheter was advanced to the Cobra catheter with a 0.014 Fathom wire. The micro catheter and micro wire combination were used to select the uterine artery. Angiogram was performed. Once we confirmed position the micro catheter within the distal descending left uterine artery, embolization was performed. Embolization of the left uterine artery was performed with 2 vial of embospheres (500 - 700 microm). Embolization was performed to 5 beat stasis. The the micro wire/micro catheter were removed, and the Cobra catheter and Bentson wire were used to form a Waltman loop. The Waltman loop was then used to select the ipsilateral hypogastric artery. Angiogram was performed to confirm position and identified the origin of the urine artery. Once we confirmed the origin, the same micro catheter and micro wire combination were used to select the uterine artery. The catheter was advanced to the distal descending right uterine artery. Angiogram was performed to confirm position. Embolization of the right uterine artery was performed with 3.5 vials of 500 -700 micro m embospheres. Five beat stasis was achieved. The Waltman loop was reduced via the left common iliac artery with the assistance of the Bentson wire, and then the catheter and wire were removed. Angiogram of the right common femoral artery was performed. An Exoseal device was deployed for hemostasis. The patient tolerated the procedure well and remained hemodynamically stable throughout. No complications were encountered and no significant blood loss was encountered. IMPRESSION: Status post ultrasound guided access right common femoral artery for pelvic angiogram and bilateral uterine artery embolization, for treatment of symptomatic uterine fibroids, augmented by image guided superior hypogastric nerve block. ExoSeal deployed for hemostasis. Signed, Dulcy Fanny. Dellia Nims,  R[facility] Radiology Electronically Signed   By: Corrie Mckusick D.O.   On: 11/14/2019 17:09   IR Fluoro Guide Ndl Plmt / BX  Result Date: 11/14/2019 INDICATION: 49 year old female presents for treatment of symptomatic uterine fibroids with embolization. The patient requested radial artery approach and a superior hypogastric nerve block. Ultrasound of the left wrist confirms a left radial artery that is 1.7 mm and the ulnar artery that is less than 1 mm. We deferred radial approach for safety. EXAM: ULTRASOUND-GUIDED ACCESS RIGHT COMMON FEMORAL ARTERY PELVIC ANGIOGRAM BILATERAL UTERINE ARTERY EMBOLIZATION IMAGE GUIDED NEEDLE PLACEMENT FOR SUPERIOR HYPOGASTRIC NERVE BLOCK. EXOSEAL FOR HEMOSTASIS MEDICATIONS: 4 mg Zofran, 30 mg IV Toradol ANESTHESIA/SEDATION: Moderate (conscious) sedation was employed during this procedure. A total of Versed 4.0 mg and Fentanyl 200 mcg was administered intravenously. Moderate Sedation Time: 70 minutes. The patient's level of consciousness and vital signs were monitored continuously by radiology nursing throughout the procedure under my direct supervision. CONTRAST:  85 cc Omni 300 FLUOROSCOPY TIME:  Fluoroscopy Time: 19 minutes 18 seconds (2,994 mGy). COMPLICATIONS: None PROCEDURE: The procedure, risks, benefits, and alternatives were explained to the patient. Questions regarding the procedure were encouraged and answered. The patient understands and consents to the procedure. The right groin was prepped with Betadine in a sterile fashion, and a sterile drape was applied covering the operative field. A sterile gown and sterile gloves were used for the procedure. Local anesthesia was provided with 1% Lidocaine. Before starting, ultrasound was performed of the left wrist. Radial artery was documented 1.7 mm, with ulnar artery even smaller. We deferred for femoral access. Ultrasound survey of the right inguinal  region was performed  with images stored and sent to PACs. The skin and subcutaneous tissue generously infiltrated with 1% lidocaine for local anesthesia, and a micropuncture needle was advanced under ultrasound guidance into the right common femoral artery. With excellent blood flow returned, micro wire was advanced. The needle was removed and a micropuncture set was advanced over the micro wire. The inner dilator in the stylet were removed, and an 035 Bentson wire was advanced into the abdominal aorta. The micro sheath was removed, a 5 [place] vascular sheath was placed into the common femoral artery. The dilator was removed and the sheath was flushed. A 5 French Cobra catheter was advanced over the Bentson wire and used to select the left common iliac artery. The Cobra catheter was advanced into the internal iliac artery. Once the catheter was over the bifurcation of the aorta, a superior hypogastric nerve block was performed via an anterior approach by directing a 22 gauge, 15 cm Chiba needle onto the anterior surface of L5 below the bifurcation of the aorta. Once the surface the needle was in contact with the vertebral body, the stylet was removed, small amount of contrast confirmed a prevertebral needle location, and a solution of contrast and ~20cc 0.5% ropivacaine was infused with fluoroscopy. Approximately 20 cc of ropivacaine was administered. The needle was removed. Angiogram of the left hypogastric artery was performed for identification of the left uterine artery. Once we identified the origin, a high-flow Renegade micro catheter was advanced to the Cobra catheter with a 0.014 Fathom wire. The micro catheter and micro wire combination were used to select the uterine artery. Angiogram was performed. Once we confirmed position the micro catheter within the distal descending left uterine artery, embolization was performed. Embolization of the left uterine artery was performed with 2 vial of embospheres (500 - 700 microm).  Embolization was performed to 5 beat stasis. The the micro wire/micro catheter were removed, and the Cobra catheter and Bentson wire were used to form a Waltman loop. The Waltman loop was then used to select the ipsilateral hypogastric artery. Angiogram was performed to confirm position and identified the origin of the urine artery. Once we confirmed the origin, the same micro catheter and micro wire combination were used to select the uterine artery. The catheter was advanced to the distal descending right uterine artery. Angiogram was performed to confirm position. Embolization of the right uterine artery was performed with 3.5 vials of 500 -700 micro m embospheres. Five beat stasis was achieved. The Waltman loop was reduced via the left common iliac artery with the assistance of the Bentson wire, and then the catheter and wire were removed. Angiogram of the right common femoral artery was performed. An Exoseal device was deployed for hemostasis. The patient tolerated the procedure well and remained hemodynamically stable throughout. No complications were encountered and no significant blood loss was encountered. IMPRESSION: Status post ultrasound guided access right common femoral artery for pelvic angiogram and bilateral uterine artery embolization, for treatment of symptomatic uterine fibroids, augmented by image guided superior hypogastric nerve block. ExoSeal deployed for hemostasis. Signed, Dulcy Fanny. Dellia Nims, RPVI [facility] Radiology Electronically Signed   By: Corrie Mckusick D.O.   On: 11/14/2019 17:09   IR EMBO TUMOR ORGAN ISCHEMIA INFARCT INC GUIDE ROADMAPPING  Result Date: 11/14/2019 INDICATION: 49 year old female presents for treatment of symptomatic uterine fibroids with embolization. The patient requested radial artery approach and a superior hypogastric nerve block. Ultrasound of the left wrist  confirms a left radial artery that is 1.7 mm and  the ulnar artery that is less than 1 mm. We deferred radial approach for safety. EXAM: ULTRASOUND-GUIDED ACCESS RIGHT COMMON FEMORAL ARTERY PELVIC ANGIOGRAM BILATERAL UTERINE ARTERY EMBOLIZATION IMAGE GUIDED NEEDLE PLACEMENT FOR SUPERIOR HYPOGASTRIC NERVE BLOCK. EXOSEAL FOR HEMOSTASIS MEDICATIONS: 4 mg Zofran, 30 mg IV Toradol ANESTHESIA/SEDATION: Moderate (conscious) sedation was employed during this procedure. A total of Versed 4.0 mg and Fentanyl 200 mcg was administered intravenously. Moderate Sedation Time: 70 minutes. The patient's level of consciousness and vital signs were monitored continuously by radiology nursing throughout the procedure under my direct supervision. CONTRAST:  85 cc Omni 300 FLUOROSCOPY TIME:  Fluoroscopy Time: 19 minutes 18 seconds (2,994 mGy). COMPLICATIONS: None PROCEDURE: The procedure, risks, benefits, and alternatives were explained to the patient. Questions regarding the procedure were encouraged and answered. The patient understands and consents to the procedure. The right groin was prepped with Betadine in a sterile fashion, and a sterile drape was applied covering the operative field. A sterile gown and sterile gloves were used for the procedure. Local anesthesia was provided with 1% Lidocaine. Before starting, ultrasound was performed of the left wrist. Radial artery was documented 1.7 mm, with ulnar artery even smaller. We deferred for femoral access. Ultrasound survey of the right inguinal region was performed with images stored and sent to PACs. The skin and subcutaneous tissue generously infiltrated with 1% lidocaine for local anesthesia, and a micropuncture needle was advanced under ultrasound guidance into the right common femoral artery. With excellent blood flow returned, micro wire was advanced. The needle was removed and a micropuncture set was advanced over the micro wire. The inner dilator in the stylet were removed, and an 035 Bentson wire was advanced into the  abdominal aorta. The micro sheath was removed, a 5 [place] vascular sheath was placed into the common femoral artery. The dilator was removed and the sheath was flushed. A 5 French Cobra catheter was advanced over the Bentson wire and used to select the left common iliac artery. The Cobra catheter was advanced into the internal iliac artery. Once the catheter was over the bifurcation of the aorta, a superior hypogastric nerve block was performed via an anterior approach by directing a 22 gauge, 15 cm Chiba needle onto the anterior surface of L5 below the bifurcation of the aorta. Once the surface the needle was in contact with the vertebral body, the stylet was removed, small amount of contrast confirmed a prevertebral needle location, and a solution of contrast and ~20cc 0.5% ropivacaine was infused with fluoroscopy. Approximately 20 cc of ropivacaine was administered. The needle was removed. Angiogram of the left hypogastric artery was performed for identification of the left uterine artery. Once we identified the origin, a high-flow Renegade micro catheter was advanced to the Cobra catheter with a 0.014 Fathom wire. The micro catheter and micro wire combination were used to select the uterine artery. Angiogram was performed. Once we confirmed position the micro catheter within the distal descending left uterine artery, embolization was performed. Embolization of the left uterine artery was performed with 2 vial of embospheres (500 - 700 microm). Embolization was performed to 5 beat stasis. The the micro wire/micro catheter were removed, and the Cobra catheter and Bentson wire were used to form a Waltman loop. The Waltman loop was then used to select the ipsilateral hypogastric artery. Angiogram was performed to confirm position and identified the origin of the urine artery. Once we confirmed the origin, the same  micro catheter and micro wire combination were used to select the uterine artery. The catheter was  advanced to the distal descending right uterine artery. Angiogram was performed to confirm position. Embolization of the right uterine artery was performed with 3.5 vials of 500 -700 micro m embospheres. Five beat stasis was achieved. The Waltman loop was reduced via the left common iliac artery with the assistance of the Bentson wire, and then the catheter and wire were removed. Angiogram of the right common femoral artery was performed. An Exoseal device was deployed for hemostasis. The patient tolerated the procedure well and remained hemodynamically stable throughout. No complications were encountered and no significant blood loss was encountered. IMPRESSION: Status post ultrasound guided access right common femoral artery for pelvic angiogram and bilateral uterine artery embolization, for treatment of symptomatic uterine fibroids, augmented by image guided superior hypogastric nerve block. ExoSeal deployed for hemostasis. Signed, Dulcy Fanny. Dellia Nims, RPVI [facility] Radiology Electronically Signed   By: Corrie Mckusick D.O.   On: 11/14/2019 17:09   IR Radiologist Eval & Mgmt  Result Date: 10/22/2019 Please refer to notes tab for details about interventional procedure. (Op Note)   Treatments: analgesia: Dilaudid  Discharge Exam: Blood pressure 135/79, pulse 95, temperature 98.4 F (36.9 C), temperature source Oral, resp. rate 18, height 5\' 7"  (1.702 m), weight 210 lb (95.3 kg), last menstrual period 10/16/2019, SpO2 97 %. Physical Exam Constitutional:      General: She is not in acute distress. HENT:     Mouth/Throat:     Mouth: Mucous membranes are moist.     Pharynx: Oropharynx is clear.  Cardiovascular:     Rate and Rhythm: Normal rate and regular rhythm.     Pulses: Normal pulses.     Heart sounds: Normal heart sounds.     Comments: Right femoral groin site is clean and dry. Surrounding skin is soft, non-tender. No evidence of hematoma.    Pulmonary:     Effort: Pulmonary effort is normal.     Breath sounds: Normal breath sounds.  Abdominal:     General: Bowel sounds are normal.     Palpations: Abdomen is soft.  Genitourinary:    Comments: She has voided  Musculoskeletal:        General: Normal range of motion.  Skin:    General: Skin is warm and dry.  Neurological:     Mental Status: She is alert and oriented to person, place, and time.     Disposition: Discharge disposition: 01-Home or Self Care        Allergies as of 11/15/2019   No Known Allergies     Medication List    TAKE these medications   acetaminophen 500 MG tablet Commonly known as: TYLENOL Take 500 mg by mouth every 6 (six) hours as needed for headache.   albuterol 108 (90 Base) MCG/ACT inhaler Commonly known as: VENTOLIN HFA Inhale 1 puff into the lungs every 6 (six) hours as needed for wheezing or shortness of breath.   Dulcolax 5 MG EC tablet Generic drug: bisacodyl Take 1 tablet (5 mg total) by mouth daily as needed for moderate constipation.   HYDROcodone-acetaminophen 5-325 MG tablet Commonly known as: Norco Take 1-2 tablets by mouth every 6 (six) hours as needed for moderate pain.   ibuprofen 200 MG tablet Commonly known as: Advil Take 4 tablets (800 mg total) by mouth every 8 (eight) hours as needed for moderate pain.   ondansetron 8 MG  tablet Commonly known as: ZOFRAN Take 8 mg by mouth 2 (two) times daily as needed for nausea or vomiting. What changed: Another medication with the same name was added. Make sure you understand how and when to take each.   ondansetron 4 MG tablet Commonly known as: Zofran Take 1 tablet (4 mg total) by mouth every 8 (eight) hours as needed for nausea or vomiting. What changed: You were already taking a medication with the same name, and this prescription was added. Make sure you understand how and when to take each.   Symbicort 160-4.5 MCG/ACT inhaler Generic drug:  budesonide-formoterol Inhale 1 puff into the lungs 2 (two) times daily.       Follow-up Information    [facility] Follow up.   Why: A scheduler from our office will call to arrange a follow up appt in 2-4 weeks. Please call our office with any questions prior to your visit.  Contact information: [place] [id number] [phone]                Electronically Signed: Theresa Duty, NP 11/15/2019, 1:44 PM   I have spent Greater Than 30 Minutes discharging [place].

## 2019-12-04 ENCOUNTER — Ambulatory Visit
Admission: RE | Admit: 2019-12-04 | Discharge: 2019-12-04 | Disposition: A | Discharge status: Home / Self Care | Payer: BC Managed Care – PPO | Source: Ambulatory Visit | Attending: Student | Admitting: Student

## 2019-12-04 ENCOUNTER — Encounter: Payer: Self-pay | Admitting: *Deleted

## 2019-12-04 ENCOUNTER — Telehealth: Payer: Self-pay | Admitting: Hematology and Oncology

## 2019-12-04 ENCOUNTER — Other Ambulatory Visit: Payer: Self-pay

## 2019-12-04 DIAGNOSIS — D259 Leiomyoma of uterus, unspecified: Secondary | ICD-10-CM

## 2019-12-04 HISTORY — PX: IR RADIOLOGIST EVAL & MGMT: IMG5224

## 2019-12-04 NOTE — Telephone Encounter (Signed)
Called per 9/08 sch msg - unable to reach pt - left message for patient with appt date and time

## 2019-12-04 NOTE — Progress Notes (Signed)
Chief Complaint: Symptomatic Uterine Fibroids    Referring Physician(s): Dr. Ronny Bacon Cole,Tara(Ob-Gyn) Alvy Bimler (Hematology) Ninfa Linden (Surgery)   History of Present Illness: Terri Kirby a 48 y.o.(G0, P0)femalepresenting today to [facility] as a scheduled follow up, status post treatment of symptomatic uterine fibroids with bilateral uterine artery embolization performed 11/14/19, with superior hypogastric nerve block.    She joins [place] today by virtual visit, and I confirmed her identify using 2 personal identifiers.   She tells me that she feels that she has mostly recovered, with no concerns for the right CFA access site, and no concern for any infection.  She does report some ongoing low grade pelvic "soreness" that is manageable and has improved much since the first week or 2.  She also reports some brownish/pinkish discharge that has no odor.  She denies any fever.   Ultimately we treated her via CFA approach, as her left radial artery measured 1.28mm, and I discussed the better safety profile of CFA access.    She tells me that he has not had any spotting or bleeding.   She denies any other symptoms.   She will be having upcoming surgery with Dr. Ninfa Linden regarding a breast papilloma, bu within the next few weeks.     Past Medical History:  Diagnosis Date  . Anemia   . Iron deficiency anemia 11/13/2015    Past Surgical History:  Procedure Laterality Date  . IR ANGIOGRAM PELVIS SELECTIVE OR SUPRASELECTIVE  11/14/2019  . IR ANGIOGRAM SELECTIVE EACH ADDITIONAL VESSEL  11/14/2019  . IR ANGIOGRAM SELECTIVE EACH ADDITIONAL VESSEL  11/14/2019  . IR EMBO TUMOR ORGAN ISCHEMIA INFARCT INC GUIDE ROADMAPPING  11/14/2019  . IR FLUORO GUIDED NEEDLE PLC ASPIRATION/INJECTION LOC  11/14/2019  . IR RADIOLOGIST EVAL & MGMT  10/03/2019  . IR RADIOLOGIST EVAL & MGMT  10/22/2019  . IR US GUIDE VASC ACCESS RIGHT  11/14/2019  . SKIN BIOPSY      Allergies: Patient has no  known allergies.  Medications: Prior to Admission medications   Medication Sig Start Date End Date Taking? Authorizing Provider  acetaminophen (TYLENOL) 500 MG tablet Take 500 mg by mouth every 6 (six) hours as needed for headache.    [provider]  albuterol (PROVENTIL HFA;VENTOLIN HFA) 108 (90 Base) MCG/ACT inhaler Inhale 1 puff into the lungs every 6 (six) hours as needed for wheezing or shortness of breath.    [provider]  bisacodyl (DULCOLAX) 5 MG EC tablet Take 1 tablet (5 mg total) by mouth daily as needed for moderate constipation. 11/15/19   Theresa Duty, NP  HYDROcodone-acetaminophen (NORCO) 5-325 MG tablet Take 1-2 tablets by mouth every 6 (six) hours as needed for moderate pain. 11/15/19   Theresa Duty, NP  ibuprofen (ADVIL) 200 MG tablet Take 4 tablets (800 mg total) by mouth every 8 (eight) hours as needed for moderate pain. 11/15/19   Theresa Duty, NP  ondansetron (ZOFRAN) 4 MG tablet Take 1 tablet (4 mg total) by mouth every 8 (eight) hours as needed for nausea or vomiting. 11/15/19   Theresa Duty, NP  ondansetron (ZOFRAN) 8 MG tablet Take 8 mg by mouth 2 (two) times daily as needed for nausea or vomiting.  09/17/19   [provider]  SYMBICORT 160-4.5 MCG/ACT inhaler Inhale 1 puff into the lungs 2 (two) times daily. 07/24/19   [provider]     No family history on file.  Social History   Socioeconomic  History  . Marital status: Single    Spouse name: Not on file  . Number of children: 0  . Years of education: Not on file  . Highest education level: Not on file  Occupational History  . Occupation: Technical brewer: [employer]  Tobacco Use  . Smoking status: Never Smoker  . Smokeless tobacco: Never Used  Substance and Sexual Activity  . Alcohol use: Yes    Comment: socially  . Drug use: Not on file  . Sexual activity: Not on file  Other Topics Concern  . Not on file  Social  History Narrative   Vegetarian since 44   Social Determinants of Health   Financial Resource Strain:   . Difficulty of Paying Living Expenses: Not on file  Food Insecurity:   . Worried About Charity fundraiser in the Last Year: Not on file  . Ran Out of Food in the Last Year: Not on file  Transportation Needs:   . Lack of Transportation (Medical): Not on file  . Lack of Transportation (Non-Medical): Not on file  Physical Activity:   . Days of Exercise per Week: Not on file  . Minutes of Exercise per Session: Not on file  Stress:   . Feeling of Stress : Not on file  Social Connections:   . Frequency of Communication with Friends and Family: Not on file  . Frequency of Social Gatherings with Friends and Family: Not on file  . Attends Religious Services: Not on file  . Active Member of Clubs or Organizations: Not on file  . Attends Archivist Meetings: Not on file  . Marital Status: Not on file       Review of Systems  Review of Systems: A 12 point ROS discussed and pertinent positives are indicated in the HPI above.  All other systems are negative.  Physical Exam No direct physical exam was performed (except for noted visual exam findings with Video Visits).    Vital Signs: There were no vitals taken for this visit.  Imaging: IR Angiogram Pelvis Selective Or Supraselective  Result Date: 11/14/2019 INDICATION: 49 year old female presents for treatment of symptomatic uterine fibroids with embolization. The patient requested radial artery approach and a superior hypogastric nerve block. Ultrasound of the left wrist confirms a left radial artery that is 1.7 mm and the ulnar artery that is less than 1 mm. We deferred radial approach for safety. EXAM: ULTRASOUND-GUIDED ACCESS RIGHT COMMON FEMORAL ARTERY PELVIC ANGIOGRAM BILATERAL UTERINE ARTERY EMBOLIZATION IMAGE GUIDED NEEDLE PLACEMENT FOR SUPERIOR HYPOGASTRIC NERVE BLOCK. EXOSEAL FOR HEMOSTASIS MEDICATIONS: 4 mg  Zofran, 30 mg IV Toradol ANESTHESIA/SEDATION: Moderate (conscious) sedation was employed during this procedure. A total of Versed 4.0 mg and Fentanyl 200 mcg was administered intravenously. Moderate Sedation Time: 70 minutes. The patient's level of consciousness and vital signs were monitored continuously by radiology nursing throughout the procedure under my direct supervision. CONTRAST:  85 cc Omni 300 FLUOROSCOPY TIME:  Fluoroscopy Time: 19 minutes 18 seconds (2,994 mGy). COMPLICATIONS: None PROCEDURE: The procedure, risks, benefits, and alternatives were explained to the patient. Questions regarding the procedure were encouraged and answered. The patient understands and consents to the procedure. The right groin was prepped with Betadine in a sterile fashion, and a sterile drape was applied covering the operative field. A sterile gown and sterile gloves were used for the procedure. Local anesthesia was provided with 1% Lidocaine. Before starting, ultrasound was performed of the left wrist. Radial artery was  documented 1.7 mm, with ulnar artery even smaller. We deferred for femoral access. Ultrasound survey of the right inguinal region was performed with images stored and sent to PACs. The skin and subcutaneous tissue generously infiltrated with 1% lidocaine for local anesthesia, and a micropuncture needle was advanced under ultrasound guidance into the right common femoral artery. With excellent blood flow returned, micro wire was advanced. The needle was removed and a micropuncture set was advanced over the micro wire. The inner dilator in the stylet were removed, and an 035 Bentson wire was advanced into the abdominal aorta. The micro sheath was removed, a 5 [place] vascular sheath was placed into the common femoral artery. The dilator was removed and the sheath was flushed. A 5 French Cobra catheter was advanced over the Bentson wire and used to select the left common iliac artery. The Cobra catheter was  advanced into the internal iliac artery. Once the catheter was over the bifurcation of the aorta, a superior hypogastric nerve block was performed via an anterior approach by directing a 22 gauge, 15 cm Chiba needle onto the anterior surface of L5 below the bifurcation of the aorta. Once the surface the needle was in contact with the vertebral body, the stylet was removed, small amount of contrast confirmed a prevertebral needle location, and a solution of contrast and ~20cc 0.5% ropivacaine was infused with fluoroscopy. Approximately 20 cc of ropivacaine was administered. The needle was removed. Angiogram of the left hypogastric artery was performed for identification of the left uterine artery. Once we identified the origin, a high-flow Renegade micro catheter was advanced to the Cobra catheter with a 0.014 Fathom wire. The micro catheter and micro wire combination were used to select the uterine artery. Angiogram was performed. Once we confirmed position the micro catheter within the distal descending left uterine artery, embolization was performed. Embolization of the left uterine artery was performed with 2 vial of embospheres (500 - 700 microm). Embolization was performed to 5 beat stasis. The the micro wire/micro catheter were removed, and the Cobra catheter and Bentson wire were used to form a Waltman loop. The Waltman loop was then used to select the ipsilateral hypogastric artery. Angiogram was performed to confirm position and identified the origin of the urine artery. Once we confirmed the origin, the same micro catheter and micro wire combination were used to select the uterine artery. The catheter was advanced to the distal descending right uterine artery. Angiogram was performed to confirm position. Embolization of the right uterine artery was performed with 3.5 vials of 500 -700 micro m embospheres. Five beat stasis was achieved. The Waltman loop was reduced via the left common iliac artery with the  assistance of the Bentson wire, and then the catheter and wire were removed. Angiogram of the right common femoral artery was performed. An Exoseal device was deployed for hemostasis. The patient tolerated the procedure well and remained hemodynamically stable throughout. No complications were encountered and no significant blood loss was encountered. IMPRESSION: Status post ultrasound guided access right common femoral artery for pelvic angiogram and bilateral uterine artery embolization, for treatment of symptomatic uterine fibroids, augmented by image guided superior hypogastric nerve block. ExoSeal deployed for hemostasis. Signed, Dulcy Fanny. Dellia Nims, RPVI [facility] Rad[facility] Radiology Electronically Signed   By: Corrie Mckusick D.O.   On: 11/14/2019 17:09   IR Angiogram Selective Each Additional Vessel  Result Date: 11/14/2019 INDICATION: 49 year old female presents for treatment of symptomatic uterine fibroids with embolization. The  patient requested radial artery approach and a superior hypogastric nerve block. Ultrasound of the left wrist confirms a left radial artery that is 1.7 mm and the ulnar artery that is less than 1 mm. We deferred radial approach for safety. EXAM: ULTRASOUND-GUIDED ACCESS RIGHT COMMON FEMORAL ARTERY PELVIC ANGIOGRAM BILATERAL UTERINE ARTERY EMBOLIZATION IMAGE GUIDED NEEDLE PLACEMENT FOR SUPERIOR HYPOGASTRIC NERVE BLOCK. EXOSEAL FOR HEMOSTASIS MEDICATIONS: 4 mg Zofran, 30 mg IV Toradol ANESTHESIA/SEDATION: Moderate (conscious) sedation was employed during this procedure. A total of Versed 4.0 mg and Fentanyl 200 mcg was administered intravenously. Moderate Sedation Time: 70 minutes. The patient's level of consciousness and vital signs were monitored continuously by radiology nursing throughout the procedure under my direct supervision. CONTRAST:  85 cc Omni 300 FLUOROSCOPY TIME:  Fluoroscopy Time: 19 minutes 18 seconds (2,994 mGy).  COMPLICATIONS: None PROCEDURE: The procedure, risks, benefits, and alternatives were explained to the patient. Questions regarding the procedure were encouraged and answered. The patient understands and consents to the procedure. The right groin was prepped with Betadine in a sterile fashion, and a sterile drape was applied covering the operative field. A sterile gown and sterile gloves were used for the procedure. Local anesthesia was provided with 1% Lidocaine. Before starting, ultrasound was performed of the left wrist. Radial artery was documented 1.7 mm, with ulnar artery even smaller. We deferred for femoral access. Ultrasound survey of the right inguinal region was performed with images stored and sent to PACs. The skin and subcutaneous tissue generously infiltrated with 1% lidocaine for local anesthesia, and a micropuncture needle was advanced under ultrasound guidance into the right common femoral artery. With excellent blood flow returned, micro wire was advanced. The needle was removed and a micropuncture set was advanced over the micro wire. The inner dilator in the stylet were removed, and an 035 Bentson wire was advanced into the abdominal aorta. The micro sheath was removed, a 5 [place] vascular sheath was placed into the common femoral artery. The dilator was removed and the sheath was flushed. A 5 French Cobra catheter was advanced over the Bentson wire and used to select the left common iliac artery. The Cobra catheter was advanced into the internal iliac artery. Once the catheter was over the bifurcation of the aorta, a superior hypogastric nerve block was performed via an anterior approach by directing a 22 gauge, 15 cm Chiba needle onto the anterior surface of L5 below the bifurcation of the aorta. Once the surface the needle was in contact with the vertebral body, the stylet was removed, small amount of contrast confirmed a prevertebral needle location, and a solution of contrast and ~20cc 0.5%  ropivacaine was infused with fluoroscopy. Approximately 20 cc of ropivacaine was administered. The needle was removed. Angiogram of the left hypogastric artery was performed for identification of the left uterine artery. Once we identified the origin, a high-flow Renegade micro catheter was advanced to the Cobra catheter with a 0.014 Fathom wire. The micro catheter and micro wire combination were used to select the uterine artery. Angiogram was performed. Once we confirmed position the micro catheter within the distal descending left uterine artery, embolization was performed. Embolization of the left uterine artery was performed with 2 vial of embospheres (500 - 700 microm). Embolization was performed to 5 beat stasis. The the micro wire/micro catheter were removed, and the Cobra catheter and Bentson wire were used to form a Waltman loop. The Waltman loop was then used to select the ipsilateral hypogastric artery. Angiogram was performed to confirm  position and identified the origin of the urine artery. Once we confirmed the origin, the same micro catheter and micro wire combination were used to select the uterine artery. The catheter was advanced to the distal descending right uterine artery. Angiogram was performed to confirm position. Embolization of the right uterine artery was performed with 3.5 vials of 500 -700 micro m embospheres. Five beat stasis was achieved. The Waltman loop was reduced via the left common iliac artery with the assistance of the Bentson wire, and then the catheter and wire were removed. Angiogram of the right common femoral artery was performed. An Exoseal device was deployed for hemostasis. The patient tolerated the procedure well and remained hemodynamically stable throughout. No complications were encountered and no significant blood loss was encountered. IMPRESSION: Status post ultrasound guided access right common femoral artery for pelvic angiogram and bilateral uterine artery  embolization, for treatment of symptomatic uterine fibroids, augmented by image guided superior hypogastric nerve block. ExoSeal deployed for hemostasis. Signed, Dulcy Fanny. Dellia Nims, RPVI Vascular and Interventional Radiology Specialists [facility] Radiology Electronically Signed   By: Corrie Mckusick D.O.   On: 11/14/2019 17:09   IR Angiogram Selective Each Additional Vessel  Result Date: 11/14/2019 INDICATION: 49 year old female presents for treatment of symptomatic uterine fibroids with embolization. The patient requested radial artery approach and a superior hypogastric nerve block. Ultrasound of the left wrist confirms a left radial artery that is 1.7 mm and the ulnar artery that is less than 1 mm. We deferred radial approach for safety. EXAM: ULTRASOUND-GUIDED ACCESS RIGHT COMMON FEMORAL ARTERY PELVIC ANGIOGRAM BILATERAL UTERINE ARTERY EMBOLIZATION IMAGE GUIDED NEEDLE PLACEMENT FOR SUPERIOR HYPOGASTRIC NERVE BLOCK. EXOSEAL FOR HEMOSTASIS MEDICATIONS: 4 mg Zofran, 30 mg IV Toradol ANESTHESIA/SEDATION: Moderate (conscious) sedation was employed during this procedure. A total of Versed 4.0 mg and Fentanyl 200 mcg was administered intravenously. Moderate Sedation Time: 70 minutes. The patient's level of consciousness and vital signs were monitored continuously by radiology nursing throughout the procedure under my direct supervision. CONTRAST:  85 cc Omni 300 FLUOROSCOPY TIME:  Fluoroscopy Time: 19 minutes 18 seconds (2,994 mGy). COMPLICATIONS: None PROCEDURE: The procedure, risks, benefits, and alternatives were explained to the patient. Questions regarding the procedure were encouraged and answered. The patient understands and consents to the procedure. The right groin was prepped with Betadine in a sterile fashion, and a sterile drape was applied covering the operative field. A sterile gown and sterile gloves were used for the procedure. Local anesthesia was provided with 1% Lidocaine. Before starting,  ultrasound was performed of the left wrist. Radial artery was documented 1.7 mm, with ulnar artery even smaller. We deferred for femoral access. Ultrasound survey of the right inguinal region was performed with images stored and sent to PACs. The skin and subcutaneous tissue generously infiltrated with 1% lidocaine for local anesthesia, and a micropuncture needle was advanced under ultrasound guidance into the right common femoral artery. With excellent blood flow returned, micro wire was advanced. The needle was removed and a micropuncture set was advanced over the micro wire. The inner dilator in the stylet were removed, and an 035 Bentson wire was advanced into the abdominal aorta. The micro sheath was removed, a 5 [place] vascular sheath was placed into the common femoral artery. The dilator was removed and the sheath was flushed. A 5 French Cobra catheter was advanced over the Bentson wire and used to select the left common iliac artery. The Cobra catheter was advanced into the internal iliac artery. Once the catheter  was over the bifurcation of the aorta, a superior hypogastric nerve block was performed via an anterior approach by directing a 22 gauge, 15 cm Chiba needle onto the anterior surface of L5 below the bifurcation of the aorta. Once the surface the needle was in contact with the vertebral body, the stylet was removed, small amount of contrast confirmed a prevertebral needle location, and a solution of contrast and ~20cc 0.5% ropivacaine was infused with fluoroscopy. Approximately 20 cc of ropivacaine was administered. The needle was removed. Angiogram of the left hypogastric artery was performed for identification of the left uterine artery. Once we identified the origin, a high-flow Renegade micro catheter was advanced to the Cobra catheter with a 0.014 Fathom wire. The micro catheter and micro wire combination were used to select the uterine artery. Angiogram was performed. Once we confirmed  position the micro catheter within the distal descending left uterine artery, embolization was performed. Embolization of the left uterine artery was performed with 2 vial of embospheres (500 - 700 microm). Embolization was performed to 5 beat stasis. The the micro wire/micro catheter were removed, and the Cobra catheter and Bentson wire were used to form a Waltman loop. The Waltman loop was then used to select the ipsilateral hypogastric artery. Angiogram was performed to confirm position and identified the origin of the urine artery. Once we confirmed the origin, the same micro catheter and micro wire combination were used to select the uterine artery. The catheter was advanced to the distal descending right uterine artery. Angiogram was performed to confirm position. Embolization of the right uterine artery was performed with 3.5 vials of 500 -700 micro m embospheres. Five beat stasis was achieved. The Waltman loop was reduced via the left common iliac artery with the assistance of the Bentson wire, and then the catheter and wire were removed. Angiogram of the right common femoral artery was performed. An Exoseal device was deployed for hemostasis. The patient tolerated the procedure well and remained hemodynamically stable throughout. No complications were encountered and no significant blood loss was encountered. IMPRESSION: Status post ultrasound guided access right common femoral artery for pelvic angiogram and bilateral uterine artery embolization, for treatment of symptomatic uterine fibroids, augmented by image guided superior hypogastric nerve block. ExoSeal deployed for hemostasis. Signed, Dulcy Fanny. Dellia Nims, RPVI Vascular and Interventional Radiology Specialists [facility] Radiology Electronically Signed   By: Corrie Mckusick D.O.   On: 11/14/2019 17:09   IR US Guide Vasc Access Right  Result Date: 11/14/2019 INDICATION: 49 year old female presents for treatment of symptomatic uterine fibroids with  embolization. The patient requested radial artery approach and a superior hypogastric nerve block. Ultrasound of the left wrist confirms a left radial artery that is 1.7 mm and the ulnar artery that is less than 1 mm. We deferred radial approach for safety. EXAM: ULTRASOUND-GUIDED ACCESS RIGHT COMMON FEMORAL ARTERY PELVIC ANGIOGRAM BILATERAL UTERINE ARTERY EMBOLIZATION IMAGE GUIDED NEEDLE PLACEMENT FOR SUPERIOR HYPOGASTRIC NERVE BLOCK. EXOSEAL FOR HEMOSTASIS MEDICATIONS: 4 mg Zofran, 30 mg IV Toradol ANESTHESIA/SEDATION: Moderate (conscious) sedation was employed during this procedure. A total of Versed 4.0 mg and Fentanyl 200 mcg was administered intravenously. Moderate Sedation Time: 70 minutes. The patient's level of consciousness and vital signs were monitored continuously by radiology nursing throughout the procedure under my direct supervision. CONTRAST:  85 cc Omni 300 FLUOROSCOPY TIME:  Fluoroscopy Time: 19 minutes 18 seconds (2,994 mGy). COMPLICATIONS: None PROCEDURE: The procedure, risks, benefits, and alternatives were explained to the patient. Questions regarding the procedure were  encouraged and answered. The patient understands and consents to the procedure. The right groin was prepped with Betadine in a sterile fashion, and a sterile drape was applied covering the operative field. A sterile gown and sterile gloves were used for the procedure. Local anesthesia was provided with 1% Lidocaine. Before starting, ultrasound was performed of the left wrist. Radial artery was documented 1.7 mm, with ulnar artery even smaller. We deferred for femoral access. Ultrasound survey of the right inguinal region was performed with images stored and sent to PACs. The skin and subcutaneous tissue generously infiltrated with 1% lidocaine for local anesthesia, and a micropuncture needle was advanced under ultrasound guidance into the right common femoral artery. With excellent blood flow returned, micro wire was advanced.  The needle was removed and a micropuncture set was advanced over the micro wire. The inner dilator in the stylet were removed, and an 035 Bentson wire was advanced into the abdominal aorta. The micro sheath was removed, a 5 [place] vascular sheath was placed into the common femoral artery. The dilator was removed and the sheath was flushed. A 5 French Cobra catheter was advanced over the Bentson wire and used to select the left common iliac artery. The Cobra catheter was advanced into the internal iliac artery. Once the catheter was over the bifurcation of the aorta, a superior hypogastric nerve block was performed via an anterior approach by directing a 22 gauge, 15 cm Chiba needle onto the anterior surface of L5 below the bifurcation of the aorta. Once the surface the needle was in contact with the vertebral body, the stylet was removed, small amount of contrast confirmed a prevertebral needle location, and a solution of contrast and ~20cc 0.5% ropivacaine was infused with fluoroscopy. Approximately 20 cc of ropivacaine was administered. The needle was removed. Angiogram of the left hypogastric artery was performed for identification of the left uterine artery. Once we identified the origin, a high-flow Renegade micro catheter was advanced to the Cobra catheter with a 0.014 Fathom wire. The micro catheter and micro wire combination were used to select the uterine artery. Angiogram was performed. Once we confirmed position the micro catheter within the distal descending left uterine artery, embolization was performed. Embolization of the left uterine artery was performed with 2 vial of embospheres (500 - 700 microm). Embolization was performed to 5 beat stasis. The the micro wire/micro catheter were removed, and the Cobra catheter and Bentson wire were used to form a Waltman loop. The Waltman loop was then used to select the ipsilateral hypogastric artery. Angiogram was performed to confirm position and identified  the origin of the urine artery. Once we confirmed the origin, the same micro catheter and micro wire combination were used to select the uterine artery. The catheter was advanced to the distal descending right uterine artery. Angiogram was performed to confirm position. Embolization of the right uterine artery was performed with 3.5 vials of 500 -700 micro m embospheres. Five beat stasis was achieved. The Waltman loop was reduced via the left common iliac artery with the assistance of the Bentson wire, and then the catheter and wire were removed. Angiogram of the right common femoral artery was performed. An Exoseal device was deployed for hemostasis. The patient tolerated the procedure well and remained hemodynamically stable throughout. No complications were encountered and no significant blood loss was encountered. IMPRESSION: Status post ultrasound guided access right common femoral artery for pelvic angiogram and bilateral uterine artery embolization, for treatment of symptomatic uterine fibroids, augmented by  image guided superior hypogastric nerve block. ExoSeal deployed for hemostasis. Signed, Dulcy Fanny. Dellia Nims, RPVI V[facility] and Interventional Radiology Specialists [facility] Radiology Electronically Signed   By: Corrie Mckusick D.O.   On: 11/14/2019 17:09   IR Fluoro Guide Ndl Plmt / BX  Result Date: 11/14/2019 INDICATION: 49 year old female presents for treatment of symptomatic uterine fibroids with embolization. The patient requested radial artery approach and a superior hypogastric nerve block. Ultrasound of the left wrist confirms a left radial artery that is 1.7 mm and the ulnar artery that is less than 1 mm. We deferred radial approach for safety. EXAM: ULTRASOUND-GUIDED ACCESS RIGHT COMMON FEMORAL ARTERY PELVIC ANGIOGRAM BILATERAL UTERINE ARTERY EMBOLIZATION IMAGE GUIDED NEEDLE PLACEMENT FOR SUPERIOR HYPOGASTRIC NERVE BLOCK. EXOSEAL FOR HEMOSTASIS MEDICATIONS: 4 mg Zofran, 30 mg IV Toradol  ANESTHESIA/SEDATION: Moderate (conscious) sedation was employed during this procedure. A total of Versed 4.0 mg and Fentanyl 200 mcg was administered intravenously. Moderate Sedation Time: 70 minutes. The patient's level of consciousness and vital signs were monitored continuously by radiology nursing throughout the procedure under my direct supervision. CONTRAST:  85 cc Omni 300 FLUOROSCOPY TIME:  Fluoroscopy Time: 19 minutes 18 seconds (2,994 mGy). COMPLICATIONS: None PROCEDURE: The procedure, risks, benefits, and alternatives were explained to the patient. Questions regarding the procedure were encouraged and answered. The patient understands and consents to the procedure. The right groin was prepped with Betadine in a sterile fashion, and a sterile drape was applied covering the operative field. A sterile gown and sterile gloves were used for the procedure. Local anesthesia was provided with 1% Lidocaine. Before starting, ultrasound was performed of the left wrist. Radial artery was documented 1.7 mm, with ulnar artery even smaller. We deferred for femoral access. Ultrasound survey of the right inguinal region was performed with images stored and sent to PACs. The skin and subcutaneous tissue generously infiltrated with 1% lidocaine for local anesthesia, and a micropuncture needle was advanced under ultrasound guidance into the right common femoral artery. With excellent blood flow returned, micro wire was advanced. The needle was removed and a micropuncture set was advanced over the micro wire. The inner dilator in the stylet were removed, and an 035 Bentson wire was advanced into the abdominal aorta. The micro sheath was removed, a 5 [place] vascular sheath was placed into the common femoral artery. The dilator was removed and the sheath was flushed. A 5 French Cobra catheter was advanced over the Bentson wire and used to select the left common iliac artery. The Cobra catheter was advanced into the internal  iliac artery. Once the catheter was over the bifurcation of the aorta, a superior hypogastric nerve block was performed via an anterior approach by directing a 22 gauge, 15 cm Chiba needle onto the anterior surface of L5 below the bifurcation of the aorta. Once the surface the needle was in contact with the vertebral body, the stylet was removed, small amount of contrast confirmed a prevertebral needle location, and a solution of contrast and ~20cc 0.5% ropivacaine was infused with fluoroscopy. Approximately 20 cc of ropivacaine was administered. The needle was removed. Angiogram of the left hypogastric artery was performed for identification of the left uterine artery. Once we identified the origin, a high-flow Renegade micro catheter was advanced to the Cobra catheter with a 0.014 Fathom wire. The micro catheter and micro wire combination were used to select the uterine artery. Angiogram was performed. Once we confirmed position the micro catheter within the distal descending left uterine artery, embolization was  performed. Embolization of the left uterine artery was performed with 2 vial of embospheres (500 - 700 microm). Embolization was performed to 5 beat stasis. The the micro wire/micro catheter were removed, and the Cobra catheter and Bentson wire were used to form a Waltman loop. The Waltman loop was then used to select the ipsilateral hypogastric artery. Angiogram was performed to confirm position and identified the origin of the urine artery. Once we confirmed the origin, the same micro catheter and micro wire combination were used to select the uterine artery. The catheter was advanced to the distal descending right uterine artery. Angiogram was performed to confirm position. Embolization of the right uterine artery was performed with 3.5 vials of 500 -700 micro m embospheres. Five beat stasis was achieved. The Waltman loop was reduced via the left common iliac artery with the assistance of the Bentson  wire, and then the catheter and wire were removed. Angiogram of the right common femoral artery was performed. An Exoseal device was deployed for hemostasis. The patient tolerated the procedure well and remained hemodynamically stable throughout. No complications were encountered and no significant blood loss was encountered. IMPRESSION: Status post ultrasound guided access right common femoral artery for pelvic angiogram and bilateral uterine artery embolization, for treatment of symptomatic uterine fibroids, augmented by image guided superior hypogastric nerve block. ExoSeal deployed for hemostasis. Signed, Dulcy Fanny. Dellia Nims, RPVI Vascular and Interventional Radiology Specialists [facility] Radiology Electronically Signed   By: Corrie Mckusick D.O.   On: 11/14/2019 17:09   IR EMBO TUMOR ORGAN ISCHEMIA INFARCT INC GUIDE ROADMAPPING  Result Date: 11/14/2019 INDICATION: 49 year old female presents for treatment of symptomatic uterine fibroids with embolization. The patient requested radial artery approach and a superior hypogastric nerve block. Ultrasound of the left wrist confirms a left radial artery that is 1.7 mm and the ulnar artery that is less than 1 mm. We deferred radial approach for safety. EXAM: ULTRASOUND-GUIDED ACCESS RIGHT COMMON FEMORAL ARTERY PELVIC ANGIOGRAM BILATERAL UTERINE ARTERY EMBOLIZATION IMAGE GUIDED NEEDLE PLACEMENT FOR SUPERIOR HYPOGASTRIC NERVE BLOCK. EXOSEAL FOR HEMOSTASIS MEDICATIONS: 4 mg Zofran, 30 mg IV Toradol ANESTHESIA/SEDATION: Moderate (conscious) sedation was employed during this procedure. A total of Versed 4.0 mg and Fentanyl 200 mcg was administered intravenously. Moderate Sedation Time: 70 minutes. The patient's level of consciousness and vital signs were monitored continuously by radiology nursing throughout the procedure under my direct supervision. CONTRAST:  85 cc Omni 300 FLUOROSCOPY TIME:  Fluoroscopy Time: 19 minutes 18 seconds (2,994 mGy). COMPLICATIONS: None  PROCEDURE: The procedure, risks, benefits, and alternatives were explained to the patient. Questions regarding the procedure were encouraged and answered. The patient understands and consents to the procedure. The right groin was prepped with Betadine in a sterile fashion, and a sterile drape was applied covering the operative field. A sterile gown and sterile gloves were used for the procedure. Local anesthesia was provided with 1% Lidocaine. Before starting, ultrasound was performed of the left wrist. Radial artery was documented 1.7 mm, with ulnar artery even smaller. We deferred for femoral access. Ultrasound survey of the right inguinal region was performed with images stored and sent to PACs. The skin and subcutaneous tissue generously infiltrated with 1% lidocaine for local anesthesia, and a micropuncture needle was advanced under ultrasound guidance into the right common femoral artery. With excellent blood flow returned, micro wire was advanced. The needle was removed and a micropuncture set was advanced over the micro wire. The inner dilator in the stylet were removed, and an [employer]  wire was advanced into the abdominal aorta. The micro sheath was removed, a 5 [place] vascular sheath was placed into the common femoral artery. The dilator was removed and the sheath was flushed. A 5 French Cobra catheter was advanced over the Bentson wire and used to select the left common iliac artery. The Cobra catheter was advanced into the internal iliac artery. Once the catheter was over the bifurcation of the aorta, a superior hypogastric nerve block was performed via an anterior approach by directing a 22 gauge, 15 cm Chiba needle onto the anterior surface of L5 below the bifurcation of the aorta. Once the surface the needle was in contact with the vertebral body, the stylet was removed, small amount of contrast confirmed a prevertebral needle location, and a solution of contrast and ~20cc 0.5% ropivacaine was  infused with fluoroscopy. Approximately 20 cc of ropivacaine was administered. The needle was removed. Angiogram of the left hypogastric artery was performed for identification of the left uterine artery. Once we identified the origin, a high-flow Renegade micro catheter was advanced to the Cobra catheter with a 0.014 Fathom wire. The micro catheter and micro wire combination were used to select the uterine artery. Angiogram was performed. Once we confirmed position the micro catheter within the distal descending left uterine artery, embolization was performed. Embolization of the left uterine artery was performed with 2 vial of embospheres (500 - 700 microm). Embolization was performed to 5 beat stasis. The the micro wire/micro catheter were removed, and the Cobra catheter and Bentson wire were used to form a Waltman loop. The Waltman loop was then used to select the ipsilateral hypogastric artery. Angiogram was performed to confirm position and identified the origin of the urine artery. Once we confirmed the origin, the same micro catheter and micro wire combination were used to select the uterine artery. The catheter was advanced to the distal descending right uterine artery. Angiogram was performed to confirm position. Embolization of the right uterine artery was performed with 3.5 vials of 500 -700 micro m embospheres. Five beat stasis was achieved. The Waltman loop was reduced via the left common iliac artery with the assistance of the Bentson wire, and then the catheter and wire were removed. Angiogram of the right common femoral artery was performed. An Exoseal device was deployed for hemostasis. The patient tolerated the procedure well and remained hemodynamically stable throughout. No complications were encountered and no significant blood loss was encountered. IMPRESSION: Status post ultrasound guided access right common femoral artery for pelvic angiogram and bilateral uterine artery embolization, for  treatment of symptomatic uterine fibroids, augmented by image guided superior hypogastric nerve block. ExoSeal deployed for hemostasis. Signed, Dulcy Fanny. Dellia Nims, RPVI Vascular and Interventional Radiology Specialists [facility] Radiology Electronically Signed   By: Corrie Mckusick D.O.   On: 11/14/2019 17:09    Labs:  CBC: Recent Labs    09/05/19 1348 09/13/19 1347 10/21/19 1033 11/14/19 1246  WBC 9.0 8.5 6.5 8.4  HGB 8.1* 9.5* 11.6* 12.9  HCT 27.2* 32.5* 36.3 40.0  PLT 278 374 296 283    COAGS: Recent Labs    11/14/19 1246  INR 1.0    BMP: Recent Labs    11/14/19 1246  NA 138  K 4.0  CL 105  CO2 24  GLUCOSE 85  BUN 9  CALCIUM 9.0  CREATININE 1.04*  GFRNONAA >60  GFRAA >60    LIVER FUNCTION TESTS: No results for input(s): BILITOT, AST, ALT, ALKPHOS, PROT, ALBUMIN in the last  8760 hours.  TUMOR MARKERS: No results for input(s): AFPTM, CEA, CA199, CHROMGRNA in the last 8760 hours.  Assessment and Plan:  Terri Kirby is 49 year old female SP bilateral uterine artery embolization for symptomatic uterine fibroids, with superior hypogastric nerve block.   She is recovering well, with no concerning post operative symptoms.    Today we discussed our typical follow up schedule, with the next visit at about 6 months post op. Between then and now, should she have any problems, she should reach out to discuss.  I reminded her that in the first few weeks to months, she might experience some further discharge, including possible passage of tissue as a consequence of fibroid sloughing.  After the first 3 months, we typically see a trend of improved symptoms of decreased frequency/severity of cycles/bleeding, and improving bulk symptoms.  Imaging is typically reserved for troubleshooting after the fist 6 months.    She understand our treatment plan.   Plan: - Repeat office visit in 6 months - We are happy to see her back in the interval for any concerns.  Electronically  Signed: Corrie Mckusick 12/04/2019, 4:40 PM   I spent a total of    25 Minutes in remote  clinical consultation, greater than 50% of which was counseling/coordinating care for SP bilateral uterine artery embolization for symptomatic uterine fibroids. .    Visit type: Audio only (telephone). Audio (no video) only due to patient's lack of internet/smartphone capability. Alternative for in-person consultation at [facility], [address] [place], [place], [place]. This visit type was conducted due to national recommendations for restrictions regarding the COVID-19 Pandemic (e.g. social distancing).  This format is felt to be most appropriate for this patient at this time.  All issues noted in this document were discussed and addressed.

## 2019-12-05 ENCOUNTER — Other Ambulatory Visit: Payer: Self-pay

## 2019-12-05 ENCOUNTER — Encounter (HOSPITAL_BASED_OUTPATIENT_CLINIC_OR_DEPARTMENT_OTHER): Payer: Self-pay | Admitting: Surgery

## 2019-12-09 ENCOUNTER — Other Ambulatory Visit (HOSPITAL_COMMUNITY)
Admission: RE | Admit: 2019-12-09 | Discharge: 2019-12-09 | Disposition: A | Discharge status: Home / Self Care | Payer: BC Managed Care – PPO | Source: Ambulatory Visit | Attending: Surgery | Admitting: Surgery

## 2019-12-09 DIAGNOSIS — Z20822 Contact with and (suspected) exposure to covid-19: Secondary | ICD-10-CM | POA: Insufficient documentation

## 2019-12-09 DIAGNOSIS — Z01812 Encounter for preprocedural laboratory examination: Secondary | ICD-10-CM | POA: Diagnosis present

## 2019-12-09 LAB — SARS CORONAVIRUS 2 (TAT 6-24 HRS): SARS Coronavirus 2: NEGATIVE

## 2019-12-09 NOTE — Progress Notes (Signed)
Ensure presurgery drink compete by 0600 DOS CHG soap... shower night before with 1/2 and morning of surgery with remaining soap. Not on face or genitalia, use caution as floor of shower may become slick. Pt verbalized understanding.       Enhanced Recovery after Surgery for Orthopedics Enhanced Recovery after Surgery is a protocol used to improve the stress on your body and your recovery after surgery.  Patient Instructions  . The night before surgery:  o No food after midnight. ONLY clear liquids after midnight  . The day of surgery (if you do NOT have diabetes):  o Drink ONE (1) Pre-Surgery Clear Ensure as directed.   o This drink was given to you during your hospital  pre-op appointment visit. o The pre-op nurse will instruct you on the time to drink the  Pre-Surgery Ensure depending on your surgery time. o Finish the drink at the designated time by the pre-op nurse.  o Nothing else to drink after completing the  Pre-Surgery Clear Ensure.  . The day of surgery (if you have diabetes): o Drink ONE (1) Gatorade 2 (G2) as directed. o This drink was given to you during your hospital  pre-op appointment visit.  o The pre-op nurse will instruct you on the time to drink the   Gatorade 2 (G2) depending on your surgery time. o Color of the Gatorade may vary. Red is not allowed. o Nothing else to drink after completing the  Gatorade 2 (G2).         If you have questions, please contact your surgeon's office.

## 2019-12-11 ENCOUNTER — Other Ambulatory Visit: Payer: Self-pay

## 2019-12-11 ENCOUNTER — Ambulatory Visit
Admission: RE | Admit: 2019-12-11 | Discharge: 2019-12-11 | Disposition: A | Discharge status: Home / Self Care | Payer: BC Managed Care – PPO | Source: Ambulatory Visit | Attending: Surgery | Admitting: Surgery

## 2019-12-11 DIAGNOSIS — D241 Benign neoplasm of right breast: Secondary | ICD-10-CM

## 2019-12-11 NOTE — H&P (Signed)
Terri Kirby  Location: [facility] Surgery Patient #: [id number] DOB: Mar 04, 1971 Single / Language: Cleophus Molt / Race: Black or African American Female   History of Present Illness  The patient is a 49 year old female who presents with a complaint of nipple discharge.  Chief complaint: Right nipple discharge  This is a 49 year old female referred for discharge from the right nipple. She started noticing clear spontaneous discharge several months ago from the right nipple. She has no previous history of nipple discharge. She has had bilateral piercings in the past. She has no previous problems with her breasts. There is no family history of breast cancer. She underwent mammograms and ultrasound showing a 2.3 cm intraductal mass at the retroareolar right breast with dilated ducts. She underwent a biopsy of this showing an intraductal papilloma with no evidence of malignancy She has been found to be profoundly anemic and has been undergoing transfusion and other infusions by hematology. She has has never get menorrhagia and will be undergoing an ultrasound and possible biopsy of the uterus by her gynecologist. She is also being worked up for mild shortness of breath by pulmonology.   Past Surgical History Breast Biopsy  Right.  Diagnostic Studies History  Mammogram  within last year Pap Smear  1-5 years ago  Allergies (Tanisha A. Brown No Known Drug Allergies  [09/06/2019]: Allergies Reconciled   Medication History (Tanisha A. Owens Shark,  Albuterol Sulfate HFA (108 (90 Base)MCG/ACT Aerosol Soln, Inhalation) Active. Montelukast Sodium (10MG  Tablet, Oral) Active. Symbicort (160-4.5MCG/ACT Aerosol, Inhalation) Active. Medications Reconciled  Social History (Tanisha A. Owens Shark, [name]; Alcohol use  Occasional alcohol use. Caffeine use  Carbonated beverages, Tea. No drug use  Tobacco use  Never smoker.  Family History (Tanisha A. Owens Shark, RMA Kidney Disease   Father.  Pregnancy / Birth History (Tanisha A. Owens Shark, RMA  Age at menarche  2 years. Gravida  2 Irregular periods  Maternal age  49-30 Para  0  Other Problems Asthma  Lump In Breast     Review of Systems (Tanisha A. Owens Shark RMA; General Not Present- Appetite Loss, Chills, Fatigue, Fever, Night Sweats, Weight Gain and Weight Loss. Skin Not Present- Change in Wart/Mole, Dryness, Hives, Jaundice, New Lesions, Non-Healing Wounds, Rash and Ulcer. HEENT Present- Seasonal Allergies. Not Present- Earache, Hearing Loss, Hoarseness, Nose Bleed, Oral Ulcers, Ringing in the Ears, Sinus Pain, Sore Throat, Visual Disturbances, Wears glasses/contact lenses and Yellow Eyes. Respiratory Present- Wheezing. Not Present- Bloody sputum, Chronic Cough, Difficulty Breathing and Snoring. Breast Present- Breast Pain. Not Present- Breast Mass, Nipple Discharge and Skin Changes. Cardiovascular Not Present- Chest Pain, Difficulty Breathing Lying Down, Leg Cramps, Palpitations, Rapid Heart Rate, Shortness of Breath and Swelling of Extremities. Gastrointestinal Not Present- Abdominal Pain, Bloating, Bloody Stool, Change in Bowel Habits, Chronic diarrhea, Constipation, Difficulty Swallowing, Excessive gas, Gets full quickly at meals, Hemorrhoids, Indigestion, Nausea, Rectal Pain and Vomiting. Female Genitourinary Present- Pelvic Pain. Not Present- Frequency, Nocturia, Painful Urination and Urgency. Neurological Not Present- Decreased Memory, Fainting, Headaches, Numbness, Seizures, Tingling, Tremor, Trouble walking and Weakness. Endocrine Not Present- Cold Intolerance, Excessive Hunger, Hair Changes, Heat Intolerance, Hot flashes and New Diabetes.  Vitals   Weight: 208.6 lb Height: 67in Body Surface Area: 2.06 m Body Mass Index: 32.67 kg/m  Temp.: 97.50F  Pulse: 86 (Regular)  BP: 126/82(Sitting, Left Arm, Standard)     Physical Exam The physical exam findings are as follows: Note: She  appears well and in no distress on physical examination  There is a  small hematoma at the 12:30 position of the right breast. There is no ecchymosis. I'm uncertain whether this is the actual mass or the hematoma. There is clear spontaneous nipple discharge. There is no axillary adenopathy and no other abnormalities.    Assessment & Plan  INTRADUCTAL PAPILLOMA OF BREAST, RIGHT (D24.1)  Impression: I reviewed her notes in the electronic medical records. I have reviewed her pathology results and gave her a copy of this. I have also reviewed her mammogram, ultrasound, notes from pulmonology, and notes from hematology Again, she has an intraductal papilloma causing dilated ducts and nipple discharge. I have recommended a radioactive seed guided right breast lobectomy to remove this area for complete histologic evaluation to rule out malignancy. I discussed the reasons for this with her in detail. I discussed the surgical procedure in detail. I discussed the risk which includes but is not limited to bleeding, infection, injury to surrounding structures, the need for further surgery malignancy is found, etc.

## 2019-12-12 ENCOUNTER — Encounter (HOSPITAL_BASED_OUTPATIENT_CLINIC_OR_DEPARTMENT_OTHER): Payer: Self-pay | Admitting: Surgery

## 2019-12-12 ENCOUNTER — Ambulatory Visit (HOSPITAL_BASED_OUTPATIENT_CLINIC_OR_DEPARTMENT_OTHER): Payer: BC Managed Care – PPO | Admitting: Certified Registered"

## 2019-12-12 ENCOUNTER — Encounter (HOSPITAL_BASED_OUTPATIENT_CLINIC_OR_DEPARTMENT_OTHER)
Admission: RE | Disposition: A | Discharge status: Home / Self Care | Payer: Self-pay | Source: Home / Self Care | Attending: Surgery

## 2019-12-12 ENCOUNTER — Ambulatory Visit (HOSPITAL_BASED_OUTPATIENT_CLINIC_OR_DEPARTMENT_OTHER)
Admission: RE | Admit: 2019-12-12 | Discharge: 2019-12-12 | Disposition: A | Discharge status: Home / Self Care | Payer: BC Managed Care – PPO | Attending: Surgery | Admitting: Surgery

## 2019-12-12 ENCOUNTER — Ambulatory Visit
Admission: RE | Admit: 2019-12-12 | Discharge: 2019-12-12 | Disposition: A | Discharge status: Home / Self Care | Payer: BC Managed Care – PPO | Source: Ambulatory Visit | Attending: Surgery | Admitting: Surgery

## 2019-12-12 ENCOUNTER — Other Ambulatory Visit: Payer: Self-pay

## 2019-12-12 DIAGNOSIS — N6452 Nipple discharge: Secondary | ICD-10-CM | POA: Diagnosis not present

## 2019-12-12 DIAGNOSIS — Z6831 Body mass index (BMI) 31.0-31.9, adult: Secondary | ICD-10-CM | POA: Diagnosis not present

## 2019-12-12 DIAGNOSIS — D241 Benign neoplasm of right breast: Secondary | ICD-10-CM

## 2019-12-12 DIAGNOSIS — Z841 Family history of disorders of kidney and ureter: Secondary | ICD-10-CM | POA: Diagnosis not present

## 2019-12-12 DIAGNOSIS — D649 Anemia, unspecified: Secondary | ICD-10-CM | POA: Insufficient documentation

## 2019-12-12 DIAGNOSIS — Z79899 Other long term (current) drug therapy: Secondary | ICD-10-CM | POA: Insufficient documentation

## 2019-12-12 DIAGNOSIS — J45909 Unspecified asthma, uncomplicated: Secondary | ICD-10-CM | POA: Diagnosis not present

## 2019-12-12 DIAGNOSIS — E669 Obesity, unspecified: Secondary | ICD-10-CM | POA: Diagnosis not present

## 2019-12-12 HISTORY — DX: Other complications of anesthesia, initial encounter: T88.59XA

## 2019-12-12 HISTORY — DX: Other specified postprocedural states: R11.2

## 2019-12-12 HISTORY — PX: BREAST LUMPECTOMY WITH RADIOACTIVE SEED LOCALIZATION: SHX6424

## 2019-12-12 LAB — POCT PREGNANCY, URINE: Preg Test, Ur: NEGATIVE

## 2019-12-12 SURGERY — BREAST LUMPECTOMY WITH RADIOACTIVE SEED LOCALIZATION
Anesthesia: General | Site: Breast | Laterality: Right

## 2019-12-12 MED ORDER — BUPIVACAINE HCL 0.5 % IJ SOLN
INTRAMUSCULAR | Status: DC | PRN
  Administered 2019-12-12: 15 mL

## 2019-12-12 MED ORDER — HYDROCODONE-ACETAMINOPHEN 5-325 MG PO TABS
1.0000 | ORAL_TABLET | Freq: Four times a day (QID) | ORAL | 0 refills | Status: AC | PRN
Start: 2019-12-12 — End: ?

## 2019-12-12 MED ORDER — CEFAZOLIN SODIUM-DEXTROSE 2-4 GM/100ML-% IV SOLN
INTRAVENOUS | Status: AC
  Filled 2019-12-12: qty 100

## 2019-12-12 MED ORDER — ONDANSETRON HCL 4 MG/2ML IJ SOLN
INTRAMUSCULAR | Status: DC | PRN
  Administered 2019-12-12: 4 mg via INTRAVENOUS

## 2019-12-12 MED ORDER — CELECOXIB 200 MG PO CAPS
400.0000 mg | ORAL_CAPSULE | ORAL | Status: AC
  Administered 2019-12-12: 400 mg via ORAL

## 2019-12-12 MED ORDER — OXYCODONE HCL 5 MG PO TABS: 5.0000 mg | ORAL_TABLET | Freq: Once | ORAL | Status: DC | PRN

## 2019-12-12 MED ORDER — LACTATED RINGERS IV SOLN: INTRAVENOUS | Status: DC

## 2019-12-12 MED ORDER — CHLORHEXIDINE GLUCONATE CLOTH 2 % EX PADS: 6.0000 | MEDICATED_PAD | Freq: Once | CUTANEOUS | Status: DC

## 2019-12-12 MED ORDER — HYDROMORPHONE HCL 1 MG/ML IJ SOLN
0.2500 mg | INTRAMUSCULAR | Status: DC | PRN
  Administered 2019-12-12: 0.5 mg via INTRAVENOUS

## 2019-12-12 MED ORDER — AMISULPRIDE (ANTIEMETIC) 5 MG/2ML IV SOLN
INTRAVENOUS | Status: AC
  Filled 2019-12-12: qty 2

## 2019-12-12 MED ORDER — LIDOCAINE HCL (CARDIAC) PF 100 MG/5ML IV SOSY
PREFILLED_SYRINGE | INTRAVENOUS | Status: DC | PRN
  Administered 2019-12-12: 100 mg via INTRAVENOUS

## 2019-12-12 MED ORDER — FENTANYL CITRATE (PF) 100 MCG/2ML IJ SOLN
INTRAMUSCULAR | Status: DC | PRN
Start: 2019-12-12 — End: 2019-12-12
  Administered 2019-12-12 (×4): 25 ug via INTRAVENOUS

## 2019-12-12 MED ORDER — CELECOXIB 200 MG PO CAPS
ORAL_CAPSULE | ORAL | Status: AC
  Filled 2019-12-12: qty 2

## 2019-12-12 MED ORDER — ONDANSETRON 4 MG PO TBDP
ORAL_TABLET | ORAL | Status: AC
  Filled 2019-12-12: qty 1

## 2019-12-12 MED ORDER — AMISULPRIDE (ANTIEMETIC) 5 MG/2ML IV SOLN
10.0000 mg | Freq: Once | INTRAVENOUS | Status: AC
  Administered 2019-12-12: 10 mg via INTRAVENOUS

## 2019-12-12 MED ORDER — DEXAMETHASONE SODIUM PHOSPHATE 10 MG/ML IJ SOLN
INTRAMUSCULAR | Status: DC | PRN
  Administered 2019-12-12: 5 mg via INTRAVENOUS

## 2019-12-12 MED ORDER — OXYCODONE HCL 5 MG/5ML PO SOLN: 5.0000 mg | Freq: Once | ORAL | Status: DC | PRN

## 2019-12-12 MED ORDER — HYDROMORPHONE HCL 1 MG/ML IJ SOLN
INTRAMUSCULAR | Status: AC
  Filled 2019-12-12: qty 0.5

## 2019-12-12 MED ORDER — CEFAZOLIN SODIUM-DEXTROSE 2-4 GM/100ML-% IV SOLN
2.0000 g | INTRAVENOUS | Status: AC
  Administered 2019-12-12: 2 g via INTRAVENOUS

## 2019-12-12 MED ORDER — SCOPOLAMINE 1 MG/3DAYS TD PT72
MEDICATED_PATCH | TRANSDERMAL | Status: DC | PRN
  Administered 2019-12-12: 1 via TRANSDERMAL

## 2019-12-12 MED ORDER — FENTANYL CITRATE (PF) 100 MCG/2ML IJ SOLN
INTRAMUSCULAR | Status: AC
  Filled 2019-12-12: qty 2

## 2019-12-12 MED ORDER — MEPERIDINE HCL 25 MG/ML IJ SOLN: 6.2500 mg | INTRAMUSCULAR | Status: DC | PRN

## 2019-12-12 MED ORDER — ENSURE PRE-SURGERY PO LIQD: 296.0000 mL | Freq: Once | ORAL | Status: DC

## 2019-12-12 MED ORDER — MIDAZOLAM HCL 5 MG/5ML IJ SOLN
INTRAMUSCULAR | Status: DC | PRN
  Administered 2019-12-12: 2 mg via INTRAVENOUS

## 2019-12-12 MED ORDER — ACETAMINOPHEN 500 MG PO TABS
1000.0000 mg | ORAL_TABLET | ORAL | Status: AC
  Administered 2019-12-12: 1000 mg via ORAL

## 2019-12-12 MED ORDER — ONDANSETRON 4 MG PO TBDP
4.0000 mg | ORAL_TABLET | Freq: Once | ORAL | Status: AC
  Administered 2019-12-12: 4 mg via ORAL

## 2019-12-12 MED ORDER — PROMETHAZINE HCL 25 MG/ML IJ SOLN: 6.2500 mg | INTRAMUSCULAR | Status: DC | PRN

## 2019-12-12 MED ORDER — PROPOFOL 10 MG/ML IV BOLUS
INTRAVENOUS | Status: DC | PRN
  Administered 2019-12-12: 200 mg via INTRAVENOUS

## 2019-12-12 MED ORDER — MIDAZOLAM HCL 2 MG/2ML IJ SOLN
INTRAMUSCULAR | Status: AC
  Filled 2019-12-12: qty 2

## 2019-12-12 MED ORDER — ACETAMINOPHEN 500 MG PO TABS
ORAL_TABLET | ORAL | Status: AC
  Filled 2019-12-12: qty 2

## 2019-12-12 MED ORDER — GABAPENTIN 300 MG PO CAPS
ORAL_CAPSULE | ORAL | Status: AC
  Filled 2019-12-12: qty 1

## 2019-12-12 MED ORDER — GABAPENTIN 300 MG PO CAPS
300.0000 mg | ORAL_CAPSULE | ORAL | Status: AC
  Administered 2019-12-12: 300 mg via ORAL

## 2019-12-12 SURGICAL SUPPLY — 37 items
ADH SKN CLS APL DERMABOND .7 (GAUZE/BANDAGES/DRESSINGS) ×1
APL PRP STRL LF DISP 70% ISPRP (MISCELLANEOUS) ×1
BINDER BREAST XLRG (GAUZE/BANDAGES/DRESSINGS) ×2 IMPLANT
BINDER BREAST XXLRG (GAUZE/BANDAGES/DRESSINGS) IMPLANT
BLADE SURG 15 STRL LF DISP TIS (BLADE) ×1 IMPLANT
BLADE SURG 15 STRL SS (BLADE) ×2
CHLORAPREP W/TINT 26 (MISCELLANEOUS) ×2 IMPLANT
COVER BACK TABLE 60X90IN (DRAPES) ×2 IMPLANT
COVER MAYO STAND STRL (DRAPES) ×2 IMPLANT
COVER PROBE W GEL 5X96 (DRAPES) ×2 IMPLANT
DERMABOND ADVANCED (GAUZE/BANDAGES/DRESSINGS) ×1
DERMABOND ADVANCED .7 DNX12 (GAUZE/BANDAGES/DRESSINGS) ×1 IMPLANT
DRAPE LAPAROSCOPIC ABDOMINAL (DRAPES) ×2 IMPLANT
DRAPE UTILITY XL STRL (DRAPES) ×2 IMPLANT
ELECT REM PT RETURN 9FT ADLT (ELECTROSURGICAL) ×2
ELECTRODE REM PT RTRN 9FT ADLT (ELECTROSURGICAL) ×1 IMPLANT
GLOVE BIO SURGEON STRL SZ 6.5 (GLOVE) ×2 IMPLANT
GLOVE BIOGEL PI IND STRL 6.5 (GLOVE) ×3 IMPLANT
GLOVE BIOGEL PI INDICATOR 6.5 (GLOVE) ×3
GLOVE ECLIPSE 6.5 STRL STRAW (GLOVE) ×2 IMPLANT
GLOVE SURG SIGNA 7.5 PF LTX (GLOVE) ×2 IMPLANT
GOWN STRL REUS W/ TWL LRG LVL3 (GOWN DISPOSABLE) ×1 IMPLANT
GOWN STRL REUS W/ TWL XL LVL3 (GOWN DISPOSABLE) ×1 IMPLANT
GOWN STRL REUS W/TWL LRG LVL3 (GOWN DISPOSABLE) ×2
GOWN STRL REUS W/TWL XL LVL3 (GOWN DISPOSABLE) ×2
KIT MARKER MARGIN INK (KITS) ×2 IMPLANT
NEEDLE HYPO 25X1 1.5 SAFETY (NEEDLE) ×2 IMPLANT
PACK BASIN DAY SURGERY FS (CUSTOM PROCEDURE TRAY) ×2 IMPLANT
PENCIL SMOKE EVACUATOR (MISCELLANEOUS) ×2 IMPLANT
SLEEVE SCD COMPRESS KNEE MED (MISCELLANEOUS) ×2 IMPLANT
SPONGE LAP 4X18 RFD (DISPOSABLE) ×2 IMPLANT
SUT MNCRL AB 4-0 PS2 18 (SUTURE) ×2 IMPLANT
SUT VIC AB 3-0 SH 27 (SUTURE) ×2
SUT VIC AB 3-0 SH 27X BRD (SUTURE) ×1 IMPLANT
SYR CONTROL 10ML LL (SYRINGE) ×2 IMPLANT
TOWEL GREEN STERILE FF (TOWEL DISPOSABLE) ×2 IMPLANT
TRAY FAXITRON CT DISP (TRAY / TRAY PROCEDURE) ×2 IMPLANT

## 2019-12-12 NOTE — Anesthesia Postprocedure Evaluation (Signed)
Anesthesia Post Note  Patient: Terri Kirby  Procedure(s) Performed: RIGHT BREAST LUMPECTOMY WITH RADIOACTIVE SEED LOCALIZATION (Right Breast)     Patient location during evaluation: PACU Anesthesia Type: General Level of consciousness: sedated and patient cooperative Pain management: pain level controlled Vital Signs Assessment: post-procedure vital signs reviewed and stable Respiratory status: spontaneous breathing Cardiovascular status: stable Anesthetic complications: no   No complications documented.  Last Vitals:  Vitals:   12/12/19 1030 12/12/19 1151  BP: 137/82 (!) 147/82  Pulse: 92 82  Resp: 16 16  Temp:  (!) 36.2 C  SpO2: 98% 97%    Last Pain:  Vitals:   12/12/19 1151  TempSrc:   PainSc: 0-No pain                 Nolon Nations

## 2019-12-12 NOTE — Discharge Instructions (Signed)
No Tylenol or NSAIDs (Ibuprofen, Aleve, etc.) before 2:40pm today.  [facility] Office Phone Number [phone]  BREAST BIOPSY/ PARTIAL MASTECTOMY: POST OP INSTRUCTIONS  Always review your discharge instruction sheet given to you by the facility where your surgery was performed.  IF YOU HAVE DISABILITY OR FAMILY LEAVE FORMS, YOU MUST BRING THEM TO THE OFFICE FOR PROCESSING.  DO NOT GIVE THEM TO YOUR DOCTOR.  1. A prescription for pain medication may be given to you upon discharge.  Take your pain medication as prescribed, if needed.  If narcotic pain medicine is not needed, then you may take acetaminophen (Tylenol) or ibuprofen (Advil) as needed. 2. Take your usually prescribed medications unless otherwise directed 3. If you need a refill on your pain medication, please contact your pharmacy.  They will contact our office to request authorization.  Prescriptions will not be filled after 5pm or on week-ends. 4. You should eat very light the first 24 hours after surgery, such as soup, crackers, pudding, etc.  Resume your normal diet the day after surgery. 5. Most patients will experience some swelling and bruising in the breast.  Ice packs and a good support bra will help.  Swelling and bruising can take several days to resolve.  6. It is common to experience some constipation if taking pain medication after surgery.  Increasing fluid intake and taking a stool softener will usually help or prevent this problem from occurring.  A mild laxative (Milk of Magnesia or Miralax) should be taken according to package directions if there are no bowel movements after 48 hours. 7. Unless discharge instructions indicate otherwise, you may remove your bandages 24-48 hours after surgery, and you may shower at that time.  You may have steri-strips (small skin tapes) in place directly over the incision.  These strips should be left on the skin for 7-10 days.  If your surgeon used skin glue on the  incision, you may shower in 24 hours.  The glue will flake off over the next 2-3 weeks.  Any sutures or staples will be removed at the office during your follow-up visit. 8. ACTIVITIES:  You may resume regular daily activities (gradually increasing) beginning the next day.  Wearing a good support bra or sports bra minimizes pain and swelling.  You may have sexual intercourse when it is comfortable. a. You may drive when you no longer are taking prescription pain medication, you can comfortably wear a seatbelt, and you can safely maneuver your car and apply brakes. b. RETURN TO WORK:  ______________________________________________________________________________________ 9. You should see your doctor in the office for a follow-up appointment approximately two weeks after your surgery.  Your doctors nurse will typically make your follow-up appointment when she calls you with your pathology report.  Expect your pathology report 2-3 business days after your surgery.  You may call to check if you do not hear from [place] after three days. 10. OTHER INSTRUCTIONS:OK TO REMOVE BINDER AND SHOWER STARTING TOMORROW 11. ICE PACK, TYLENOL, AND IBUPROFEN ALSO FOR PAIN 12. NO VIGOROUS ACTIVITY FOR ONE WEEK _______________________________________________________________________________________________ _____________________________________________________________________________________________________________________________________ _____________________________________________________________________________________________________________________________________ _____________________________________________________________________________________________________________________________________  WHEN TO CALL YOUR DOCTOR: 1. Fever over 101.0 2. Nausea and/or vomiting. 3. Extreme swelling or bruising. 4. Continued bleeding from incision. 5. Increased pain, redness, or drainage from the incision.  The clinic staff is  available to answer your questions during regular business hours.  Please dont hesitate to call and ask to speak to one of the nurses for clinical concerns.  If you have  a medical emergency, go to the nearest emergency room or call 911.  A surgeon from [facility] is always on call at the hospital.  For further questions, please visit [email or web address]    Post Anesthesia Home Care Instructions  Activity: Get plenty of rest for the remainder of the day. A responsible individual must stay with you for 24 hours following the procedure.  For the next 24 hours, DO NOT: -Drive a car -Paediatric nurse -Drink alcoholic beverages -Take any medication unless instructed by your physician -Make any legal decisions or sign important papers.  Meals: Start with liquid foods such as gelatin or soup. Progress to regular foods as tolerated. Avoid greasy, spicy, heavy foods. If nausea and/or vomiting occur, drink only clear liquids until the nausea and/or vomiting subsides. Call your physician if vomiting continues.  Special Instructions/Symptoms: Your throat may feel dry or sore from the anesthesia or the breathing tube placed in your throat during surgery. If this causes discomfort, gargle with warm salt water. The discomfort should disappear within 24 hours.  If you had a scopolamine patch placed behind your ear for the management of post- operative nausea and/or vomiting:  1. The medication in the patch is effective for 72 hours, after which it should be removed.  Wrap patch in a tissue and discard in the trash. Wash hands thoroughly with soap and water. 2. You may remove the patch earlier than 72 hours if you experience unpleasant side effects which may include dry mouth, dizziness or visual disturbances. 3. Avoid touching the patch. Wash your hands with soap and water after contact with the patch.

## 2019-12-12 NOTE — Op Note (Signed)
RIGHT BREAST LUMPECTOMY WITH RADIOACTIVE SEED LOCALIZATION  Procedure Note  Terri Kirby 12/12/2019   Pre-op Diagnosis: RIGHT BREAST INTRADUCTAL PAPILLOMA     Post-op Diagnosis: same  Procedure(s): RIGHT BREAST LUMPECTOMY WITH RADIOACTIVE SEED LOCALIZATION  Surgeon(s): Coralie Keens, MD  Anesthesia: General  Staff:  Circulator: Izora Ribas, RN Scrub Person: Courtney Heys S  Estimated Blood Loss: Minimal               Specimens:   Indications: This is a 49 year old female presented with right nipple discharge.  She underwent a mammogram and ultrasound suggesting a mass that was subareolar.  She underwent a biopsy of this showing an intraductal papilloma.  The decision was made to proceed with a radioactive seed guided right breast lumpectomy to remove this area  Procedure: Patient brought to operating identifies correct patient.  She is placed upon the operating table general anesthesia was induced.  Her right breast was then prepped and draped in usual sterile fashion.  The radioactive seed was located with the aid of neoprobe.  It was in the subareolar right breast.  I anesthetized the lower edge of the areola with Marcaine and then made a longitudinal incision with a scalpel.  I then dissected down underneath the nipple with electrocautery going toward the radioactive seed with aid of the neoprobe.  The seed was identified in the deeper breast tissue.  I performed a lumpectomy staying widely around the seed with the cautery.  Once the specimen was removed, I marked the margins with paint and then the specimen was x-rayed confirming that the seed and previous tissue marker were in the specimen.  The specimen was then sent to pathology for evaluation.  I achieved hemostasis with cautery.  I anesthetized the incision further with Marcaine.  I then closed the subcutaneous tissue with interrupted 3-0 Vicryl sutures and closed the skin with a running 4-0 Monocryl.  Dermabond was  applied.  Patient tolerated the procedure well.  All the counts were correct at the end of the procedure.  The patient was then extubated in the operating room and taken in a stable condition to the recovery room.          Coralie Keens   Date: 12/12/2019  Time: 9:55 AM

## 2019-12-12 NOTE — Anesthesia Preprocedure Evaluation (Addendum)
Anesthesia Evaluation  Patient identified by MRN, date of birth, ID band Patient awake    Reviewed: Allergy & Precautions, NPO status , Patient's Chart, lab work & pertinent test results  History of Anesthesia Complications (+) PONV and history of anesthetic complications  Airway Mallampati: II  TM Distance: >3 FB Neck ROM: Full    Dental no notable dental hx. (+) Dental Advisory Given, Teeth Intact   Pulmonary asthma ,    Pulmonary exam normal breath sounds clear to auscultation       Cardiovascular negative cardio ROS Normal cardiovascular exam Rhythm:Regular Rate:Normal     Neuro/Psych negative neurological ROS  negative psych ROS   GI/Hepatic negative GI ROS, Neg liver ROS,   Endo/Other  negative endocrine ROS  Renal/GU negative Renal ROS     Musculoskeletal negative musculoskeletal ROS (+)   Abdominal (+) + obese,   Peds  Hematology negative hematology ROS (+) anemia ,   Anesthesia Other Findings   Reproductive/Obstetrics negative OB ROS                             Anesthesia Physical Anesthesia Plan  ASA: II  Anesthesia Plan: General   Post-op Pain Management:    Induction: Intravenous  PONV Risk Score and Plan: 4 or greater and Ondansetron, Dexamethasone, Midazolam, Scopolamine patch - Pre-op and Treatment may vary due to age or medical condition  Airway Management Planned: LMA  Additional Equipment: None  Intra-op Plan:   Post-operative Plan: Extubation in OR  Informed Consent: I have reviewed the patients History and Physical, chart, labs and discussed the procedure including the risks, benefits and alternatives for the proposed anesthesia with the patient or authorized representative who has indicated his/her understanding and acceptance.     Dental advisory given  Plan Discussed with: CRNA  Anesthesia Plan Comments:        Anesthesia Quick  Evaluation

## 2019-12-12 NOTE — Interval H&P Note (Signed)
History and Physical Interval Note: no change in H and P  12/12/2019 8:25 AM  Terri Kirby  has presented today for surgery, with the diagnosis of RIGHT BREAST INTRADUCTAL PAPILLOMA.  The various methods of treatment have been discussed with the patient and family. After consideration of risks, benefits and other options for treatment, the patient has consented to  Procedure(s): RIGHT BREAST LUMPECTOMY WITH RADIOACTIVE SEED LOCALIZATION (Right) as a surgical intervention.  The patient's history has been reviewed, patient examined, no change in status, stable for surgery.  I have reviewed the patient's chart and labs.  Questions were answered to the patient's satisfaction.     Coralie Keens

## 2019-12-12 NOTE — Transfer of Care (Signed)
Immediate Anesthesia Transfer of Care Note  Patient: Terri Kirby  Procedure(s) Performed: RIGHT BREAST LUMPECTOMY WITH RADIOACTIVE SEED LOCALIZATION (Right Breast)  Patient Location: PACU  Anesthesia Type:General  Level of Consciousness: awake, alert  and oriented  Airway & Oxygen Therapy: Patient Spontanous Breathing and Patient connected to face mask oxygen  Post-op Assessment: Report given to RN and Post -op Vital signs reviewed and stable  Post vital signs: Reviewed and stable  Last Vitals:  Vitals Value Taken Time  BP 132/88 12/12/19 1003  Temp    Pulse 91 12/12/19 1005  Resp 17 12/12/19 1005  SpO2 100 % 12/12/19 1005  Vitals shown include unvalidated device data.  Last Pain:  Vitals:   12/12/19 0838  TempSrc: Oral  PainSc: 0-No pain         Complications: No complications documented.

## 2019-12-12 NOTE — Anesthesia Procedure Notes (Signed)
Procedure Name: LMA Insertion Date/Time: 12/12/2019 9:24 AM Performed by: Lavonia Dana, CRNA Pre-anesthesia Checklist: Patient identified, Emergency Drugs available, Suction available and Patient being monitored Patient Re-evaluated:Patient Re-evaluated prior to induction Oxygen Delivery Method: Circle system utilized Preoxygenation: Pre-oxygenation with 100% oxygen Induction Type: IV induction Ventilation: Mask ventilation without difficulty LMA: LMA inserted LMA Size: 4.0 Number of attempts: 1 Airway Equipment and Method: Bite block Placement Confirmation: positive ETCO2 Tube secured with: Tape Dental Injury: Teeth and Oropharynx as per pre-operative assessment

## 2019-12-13 LAB — SURGICAL PATHOLOGY

## 2019-12-16 ENCOUNTER — Encounter (HOSPITAL_BASED_OUTPATIENT_CLINIC_OR_DEPARTMENT_OTHER): Payer: Self-pay | Admitting: Surgery

## 2019-12-23 ENCOUNTER — Other Ambulatory Visit: Payer: BC Managed Care – PPO

## 2019-12-23 ENCOUNTER — Ambulatory Visit: Payer: BC Managed Care – PPO | Admitting: Hematology and Oncology

## 2019-12-23 ENCOUNTER — Ambulatory Visit: Payer: BC Managed Care – PPO

## 2019-12-30 ENCOUNTER — Other Ambulatory Visit: Payer: Self-pay | Admitting: Hematology and Oncology

## 2020-01-01 ENCOUNTER — Telehealth: Payer: Self-pay | Admitting: Emergency Medicine

## 2020-01-01 NOTE — Telephone Encounter (Signed)
Dr Lamonte Sakai, please advise on pt email:  Terri Kirby, Terri Kirby sent to [facility] Pool Dr. Lamonte Sakai,  I was seen in your office on May 28 and July 19 because I have been experiencing increased shortness of breath and fatigue due to my asthma. I recently just had two medical procedures, an Uterine Fibroid Embolization on Aug. 19 and a surgical breast lumpectomy on Sept. 16. My employer is allowing those with high-risk medical conditions the option to telework from home remotely to reduce their risk of exposure to Covid. I am requesting a note from you that I can provide to my employer to telework for a month to reduce my risk of exposure to Covid since I have an underlying medical condition, asthma which puts me in the high-risk category. My job requires walking and traveling to various locations and work sites. I am requesting to telework remotely due to the shortness of breath, fatigue, and other symptoms that I have been experiencing associated with my asthma and to reduce my risk of exposure to Covid. The [employer]. for my employer is asking that I provide a letter  from my physician on official letterhead that will allow me to be granted a medical exception to telework from home for one month beginning on Monday, October 11-February 10, 2020. I am asking for your assistance in fulfilling this request. I can come by the office tomorrow and pick up the letter. I am also due to schedule an appointment to be seen back in your office this month for a follow-up appointment so I plan to schedule an appointment tomorrow as well.  [name]  [birth year] 03[birth year] 2071

## 2020-01-01 NOTE — Telephone Encounter (Signed)
I agree. OK to write this letter. I can try to come by this week to sign

## 2020-01-01 NOTE — Telephone Encounter (Signed)
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

## 2020-01-02 ENCOUNTER — Encounter: Payer: Self-pay | Admitting: Hematology and Oncology

## 2020-01-02 ENCOUNTER — Other Ambulatory Visit: Payer: Self-pay

## 2020-01-02 ENCOUNTER — Inpatient Hospital Stay: Payer: BC Managed Care – PPO

## 2020-01-02 ENCOUNTER — Telehealth: Payer: Self-pay

## 2020-01-02 ENCOUNTER — Telehealth: Payer: Self-pay | Admitting: Hematology and Oncology

## 2020-01-02 ENCOUNTER — Inpatient Hospital Stay (HOSPITAL_BASED_OUTPATIENT_CLINIC_OR_DEPARTMENT_OTHER): Payer: BC Managed Care – PPO | Admitting: Hematology and Oncology

## 2020-01-02 ENCOUNTER — Inpatient Hospital Stay: Payer: BC Managed Care – PPO | Attending: Hematology and Oncology

## 2020-01-02 DIAGNOSIS — Z23 Encounter for immunization: Secondary | ICD-10-CM

## 2020-01-02 DIAGNOSIS — D509 Iron deficiency anemia, unspecified: Secondary | ICD-10-CM | POA: Diagnosis not present

## 2020-01-02 DIAGNOSIS — D241 Benign neoplasm of right breast: Secondary | ICD-10-CM | POA: Diagnosis not present

## 2020-01-02 DIAGNOSIS — Z299 Encounter for prophylactic measures, unspecified: Secondary | ICD-10-CM | POA: Diagnosis not present

## 2020-01-02 DIAGNOSIS — N920 Excessive and frequent menstruation with regular cycle: Secondary | ICD-10-CM

## 2020-01-02 DIAGNOSIS — D508 Other iron deficiency anemias: Secondary | ICD-10-CM | POA: Diagnosis not present

## 2020-01-02 DIAGNOSIS — D539 Nutritional anemia, unspecified: Secondary | ICD-10-CM

## 2020-01-02 LAB — CBC WITH DIFFERENTIAL/PLATELET
Abs Immature Granulocytes: 0.02 10*3/uL (ref 0.00–0.07)
Basophils Absolute: 0.1 10*3/uL (ref 0.0–0.1)
Basophils Relative: 1 %
Eosinophils Absolute: 0.1 10*3/uL (ref 0.0–0.5)
Eosinophils Relative: 1 %
HCT: 38.3 % (ref 36.0–46.0)
Hemoglobin: 12.7 g/dL (ref 12.0–15.0)
Immature Granulocytes: 0 %
Lymphocytes Relative: 29 %
Lymphs Abs: 1.9 10*3/uL (ref 0.7–4.0)
MCH: 28.5 pg (ref 26.0–34.0)
MCHC: 33.2 g/dL (ref 30.0–36.0)
MCV: 85.9 fL (ref 80.0–100.0)
Monocytes Absolute: 0.4 10*3/uL (ref 0.1–1.0)
Monocytes Relative: 6 %
Neutro Abs: 4.2 10*3/uL (ref 1.7–7.7)
Neutrophils Relative %: 63 %
Platelets: 335 10*3/uL (ref 150–400)
RBC: 4.46 MIL/uL (ref 3.87–5.11)
RDW: 15.3 % (ref 11.5–15.5)
WBC: 6.7 10*3/uL (ref 4.0–10.5)
nRBC: 0 % (ref 0.0–0.2)

## 2020-01-02 LAB — IRON AND TIBC
Iron: 57 ug/dL (ref 41–142)
Saturation Ratios: 24 % (ref 21–57)
TIBC: 234 ug/dL — ABNORMAL LOW (ref 236–444)
UIBC: 177 ug/dL (ref 120–384)

## 2020-01-02 LAB — FERRITIN: Ferritin: 377 ng/mL — ABNORMAL HIGH (ref 11–307)

## 2020-01-02 NOTE — Progress Notes (Signed)
[place] OFFICE PROGRESS NOTE  [name], MD  ASSESSMENT & PLAN:  Iron deficiency anemia Since her recent embolization, she has no further menstruation She is no longer anemic She does not need iron infusion today I recommend close follow-up with primary care doctor If she stopped having menstruation, she does not need to return here as her anemia will likely resolve  Preventive measure We discussed the importance of preventive care and reviewed the vaccination programs.  The patient have risk factors. She does not have any prior allergic reactions to Covid-19 vaccination. She agrees to proceed with booster dose of Covid-19 vaccination today and we will administer it today at the clinic.   Papilloma of right breast She will continue close follow-up with general surgery   No orders of the defined types were placed in this encounter.   The total time spent in the appointment was 15 minutes encounter with patients including review of chart and various tests results, discussions about plan of care and coordination of care plan   All questions were answered. The patient knows to call the clinic with any problems, questions or concerns. No barriers to learning was detected.    Heath Lark, MD 10/7/20212:46 PM  INTERVAL HISTORY: MICIAH SHEALY 49 y.o. female returns for history of recurrent iron deficiency anemia Since last time I saw her, she had embolization of fibroids She had lumpectomy for abnormal mammogram She had no further menstruation in the last 2 months Her energy level is good even though she continues to have shortness of breath and fatigue She has history of chronic asthma   SUMMARY OF HEMATOLOGIC HISTORY: LEATHIA FARNELL 49 y.o. female was seen back in my office for further evaluation and management of severe iron deficiency anemia She was last seen in the office in 2017 Her office visit today is considered a new patient visit as more than 3  years have elapsed since she was last seen.  Summary of her history is as follows: She was found to have abnormal CBC from routine blood work through her primary care doctor's office.  She has pica with craving for paper and excessive chewing of ice. The patient has been a vegetarian since 1995  The patient was prescribed oral iron supplements and she takes daily but it causes significant constipation The patient has history of menorrhagia She has received blood transfusion and intravenous iron infusion in 2017 Since then, she was lost to follow-up Recently, she started to complain of excessive fatigue, bilateral leg swelling, dizziness and lightheadedness She denies chest pain but does have chronic shortness of breath from asthma She has heavy menstruation, her typical menstrual cycle is 7 to 8 days out of her cycle of every 28 days She have passage of large amount of clots The patient denies any recent signs or symptoms of bleeding such as spontaneous epistaxis, hematuria or hematochezia. She continues on her vegetarian diet She has excessive pica with chewing on ice She was told that she have uterine fibroid and is in the process of seeing gynecologist for further management of menorrhagia  She had recent abnormal mammogram with biopsy that show breast papilloma s/p lumpectomy  In June 2021, she have received 2 doses of intravenous iron infusion I have reviewed the past medical history, past surgical history, social history and family history with the patient and they are unchanged from previous note.   ALLERGIES:  has No Known Allergies.  MEDICATIONS:  Current Outpatient Medications  Medication  Sig Dispense Refill  . acetaminophen (TYLENOL) 500 MG tablet Take 500 mg by mouth every 6 (six) hours as needed for headache.    . albuterol (PROVENTIL HFA;VENTOLIN HFA) 108 (90 Base) MCG/ACT inhaler Inhale 1 puff into the lungs every 6 (six) hours as needed for wheezing or shortness of  breath.    Marland Kitchen HYDROcodone-acetaminophen (NORCO) 5-325 MG tablet Take 1 tablet by mouth every 6 (six) hours as needed for moderate pain or severe pain. 20 tablet 0  . SYMBICORT 160-4.5 MCG/ACT inhaler Inhale 1 puff into the lungs 2 (two) times daily.     No current facility-administered medications for this visit.     REVIEW OF SYSTEMS:   Constitutional: Denies fevers, chills or night sweats Eyes: Denies blurriness of vision Ears, nose, mouth, throat, and face: Denies mucositis or sore throat Cardiovascular: Denies palpitation, chest discomfort or lower extremity swelling Gastrointestinal:  Denies nausea, heartburn or change in bowel habits Skin: Denies abnormal skin rashes Lymphatics: Denies new lymphadenopathy or easy bruising Neurological:Denies numbness, tingling or new weaknesses Behavioral/Psych: Mood is stable, no new changes  All other systems were reviewed with the patient and are negative.  PHYSICAL EXAMINATION: ECOG PERFORMANCE STATUS: 1 - Symptomatic but completely ambulatory  Vitals:   01/02/20 1245  BP: 130/70  Pulse: (!) 106  Resp: 18  Temp: (!) 97.4 F (36.3 C)  SpO2: 100%   Filed Weights   01/02/20 1245  Weight: 202 lb 3.2 oz (91.7 kg)    GENERAL:alert, no distress and comfortable NEURO: alert & oriented x 3 with fluent speech, no focal motor/sensory deficits  LABORATORY DATA:  I have reviewed the data as listed     Component Value Date/Time   NA 138 11/14/2019 1246   K 4.0 11/14/2019 1246   CL 105 11/14/2019 1246   CO2 24 11/14/2019 1246   GLUCOSE 85 11/14/2019 1246   BUN 9 11/14/2019 1246   CREATININE 1.04 (H) 11/14/2019 1246   CALCIUM 9.0 11/14/2019 1246   GFRNONAA >60 11/14/2019 1246   GFRAA >60 11/14/2019 1246    No results found for: SPEP, UPEP  Lab Results  Component Value Date   WBC 6.7 01/02/2020   NEUTROABS 4.2 01/02/2020   HGB 12.7 01/02/2020   HCT 38.3 01/02/2020   MCV 85.9 01/02/2020   PLT 335 01/02/2020      Chemistry       Component Value Date/Time   NA 138 11/14/2019 1246   K 4.0 11/14/2019 1246   CL 105 11/14/2019 1246   CO2 24 11/14/2019 1246   BUN 9 11/14/2019 1246   CREATININE 1.04 (H) 11/14/2019 1246      Component Value Date/Time   CALCIUM 9.0 11/14/2019 1246

## 2020-01-02 NOTE — Telephone Encounter (Signed)
Called and given below message. She verbalized understanding. 

## 2020-01-02 NOTE — Progress Notes (Signed)
   Covid-19 Vaccination Clinic  Name:  Terri Kirby    MRN: [id number] DOB: 1970/12/03  01/02/2020  Terri Kirby was observed post Covid-19 immunization for 15 minutes without incident. She was provided with Vaccine Information Sheet and instruction to access the V-Safe system.   Terri Kirby was instructed to call 911 with any severe reactions post vaccine: Marland Kitchen Difficulty breathing  . Swelling of face and throat  . A fast heartbeat  . A bad rash all over body  . Dizziness and weakness

## 2020-01-02 NOTE — Telephone Encounter (Signed)
Scheduled per 10/7 sch message.

## 2020-01-02 NOTE — Assessment & Plan Note (Signed)
Since her recent embolization, she has no further menstruation She is no longer anemic She does not need iron infusion today I recommend close follow-up with primary care doctor If she stopped having menstruation, she does not need to return here as her anemia will likely resolve

## 2020-01-02 NOTE — Assessment & Plan Note (Signed)
She will continue close follow-up with general surgery

## 2020-01-02 NOTE — Telephone Encounter (Signed)
-----   Message from Heath Lark, MD sent at 01/02/2020  2:48 PM EDT ----- Regarding: pls let her know iron study is very good

## 2020-01-02 NOTE — Assessment & Plan Note (Signed)
We discussed the importance of preventive care and reviewed the vaccination programs.  The patient have risk factors. She does not have any prior allergic reactions to Covid-19 vaccination. She agrees to proceed with booster dose of Covid-19 vaccination today and we will administer it today at the clinic.

## 2020-01-03 ENCOUNTER — Encounter: Payer: Self-pay | Admitting: *Deleted

## 2020-01-03 NOTE — Telephone Encounter (Signed)
Yes I am ok with writing that letter. Will sign it when I'm back in office.

## 2020-01-03 NOTE — Telephone Encounter (Signed)
Letter done and stamped with RB sig  LMTCB for pt to let her know it's ready

## 2020-01-30 ENCOUNTER — Ambulatory Visit (INDEPENDENT_AMBULATORY_CARE_PROVIDER_SITE_OTHER): Payer: BC Managed Care – PPO | Admitting: Emergency Medicine

## 2020-01-30 ENCOUNTER — Encounter: Payer: Self-pay | Admitting: Emergency Medicine

## 2020-01-30 ENCOUNTER — Other Ambulatory Visit: Payer: Self-pay

## 2020-01-30 VITALS — BP 142/80 | HR 84 | Temp 97.3°F | Ht 67.0 in | Wt 204.8 lb

## 2020-01-30 DIAGNOSIS — J45909 Unspecified asthma, uncomplicated: Secondary | ICD-10-CM | POA: Diagnosis not present

## 2020-01-30 MED ORDER — BENZONATATE 100 MG PO CAPS: 100.0000 mg | ORAL_CAPSULE | Freq: Four times a day (QID) | ORAL | 1 refills | Status: AC | PRN

## 2020-01-30 MED ORDER — PANTOPRAZOLE SODIUM 40 MG PO TBEC: 40.0000 mg | DELAYED_RELEASE_TABLET | Freq: Every day | ORAL | 2 refills | Status: AC

## 2020-01-30 NOTE — Patient Instructions (Addendum)
Please continue Symbicort 2 puffs twice a day for now.  We may decide to stop this medication going forward.  Rinse and gargle after using. Keep your albuterol available use 2 puffs when you need if shortness of breath, chest tightness, wheezing. Please start taking your loratadine (Claritin) 10 mg once daily every day until next visit. Please start taking pantoprazole 40 mg once a day until next visit.  Take this medication 1 hour around food. We will arrange for a methacholine pulmonary function test and review the results next time. Follow with Dr Lamonte Sakai next available after your pulmonary function testing/methacholine so that we can review.

## 2020-01-30 NOTE — Progress Notes (Signed)
Subjective:    Patient ID: Terri Kirby, female    DOB: 05-Dec-1970, 49 y.o.   MRN: [id number]  HPI 49 year old never smoker with a history of iron deficiency anemia (Dr Alvy Bimler), new dx R intraductal breast carcinoma, childhood asthma. Less symptomatic in teen years, then worse into adulthood.   She is having more symptoms, increased morning and evening sx, can wake her at night. Was recently started on Singulair,  Symbicort 1 month ago due to these sx. Allergy sx better, SOB and wheeze are probably the same. Using her albuterol about 2x a day. Minimal cough. Nocturnal awakenings a few times a week.   Allergy tested in 2017 - ragweed, pollens, oranges, cats; formerly on immunotherapy, not currently. CBC from 2 days ago reviewed, eosinophil count normal.  She has papular hyperpigmented lesions that were bx'd and showed eosinophils  Last flare with prednisone was 2016. 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  Also with a history of right intraductal breast cancer. Pulmonary function testing 10/14/2019 reviewed by me, shows grossly normal airflows without a bronchodilator response, normal lung volumes, normal diffusion capacity. She is currently managed on Symbicort.  Has albuterol which she uses approximately 2x a day. She stopped singulair, didn't seem to get any benefit. She uses loratadine prn. She reports that she has been experiencing some persistent intermittent fatigue and dyspnea. She has had some nocturnal awakenings, almost choking associated with cough, no sour taste. Probably helped by her albuterol on those occasions. Still has a dry cough, even during the day. She had an episode of lightheadedness 10/17, felt she was going to pass out  in the shower. She can get fatigue and dyspnea with carrying groceries. She hears wheeze, and others comment on it as well.    Chest x-ray 10/17/2019, normal.  Review of Systems As per HPI      Objective:   Physical Exam Vitals:   01/30/20 1542  BP: (!) 142/80  Pulse: 84  Temp: (!) 97.3 F (36.3 C)  SpO2: 100%  Weight: 204 lb 12.8 oz (92.9 kg)  Height: 5\' 7"  (1.702 m)   Gen: Pleasant, overwt woman, in no distress,  normal affect  ENT: No lesions,  mouth clear,  oropharynx clear, no postnasal drip  Neck: No JVD, no stridor  Lungs: No use of accessory muscles, no crackles or wheezing on normal respiration, no wheeze on forced expiration  Cardiovascular: RRR, heart sounds normal, no murmur or gallops, no peripheral edema  Musculoskeletal: No deformities, no cyanosis or clubbing  Neuro: alert, awake, non focal  Skin: some old hyperpigmented papules, now scars, on her arms       Assessment & Plan:  Asthma With normal pulmonary function testing 10/14/2019 unclear how much of her daily symptomatology is due to upper airway irritation as opposed to bronchospasm.  Also remember the potential contributions of anemia, her recent treatment for breast cancer, to her day-to-day dyspnea and functional capacity.  We can continue the Symbicort for now but I think she needs a methacholine challenge to better assess.  If reassuring the we can come off maintenance bronchodilator therapy.  In the meantime we will try to control contributors to upper airway irritation and cough including rhinitis, start empiric therapy for GERD.  Please continue Symbicort 2 puffs twice a  day for now.  We may decide to stop this medication going forward.  Rinse and gargle after using. Keep your albuterol available use 2 puffs when you need if shortness of breath, chest tightness, wheezing. Please start taking your loratadine (Claritin) 10 mg once daily every day until next visit. Please start taking  pantoprazole 40 mg once a day until next visit.  Take this medication 1 hour around food. We will arrange for a methacholine pulmonary function test and review the results next time. Follow with Dr Lamonte Sakai next available after your pulmonary function testing/methacholine so that we can review.  Baltazar Apo, MD, PhD 01/30/2020, 5:26 PM [facility] [phone] or if no answer [phone]

## 2020-01-30 NOTE — Assessment & Plan Note (Signed)
With normal pulmonary function testing 10/14/2019 unclear how much of her daily symptomatology is due to upper airway irritation as opposed to bronchospasm.  Also remember the potential contributions of anemia, her recent treatment for breast cancer, to her day-to-day dyspnea and functional capacity.  We can continue the Symbicort for now but I think she needs a methacholine challenge to better assess.  If reassuring the we can come off maintenance bronchodilator therapy.  In the meantime we will try to control contributors to upper airway irritation and cough including rhinitis, start empiric therapy for GERD.  Please continue Symbicort 2 puffs twice a day for now.  We may decide to stop this medication going forward.  Rinse and gargle after using. Keep your albuterol available use 2 puffs when you need if shortness of breath, chest tightness, wheezing. Please start taking your loratadine (Claritin) 10 mg once daily every day until next visit. Please start taking pantoprazole 40 mg once a day until next visit.  Take this medication 1 hour around food. We will arrange for a methacholine pulmonary function test and review the results next time. Follow with Dr Lamonte Sakai next available after your pulmonary function testing/methacholine so that we can review.

## 2020-02-22 ENCOUNTER — Other Ambulatory Visit: Payer: Self-pay | Admitting: Emergency Medicine

## 2020-02-25 ENCOUNTER — Other Ambulatory Visit (HOSPITAL_COMMUNITY)
Admission: RE | Admit: 2020-02-25 | Discharge: 2020-02-25 | Disposition: A | Discharge status: Home / Self Care | Payer: BC Managed Care – PPO | Source: Ambulatory Visit | Attending: Emergency Medicine | Admitting: Emergency Medicine

## 2020-02-25 ENCOUNTER — Other Ambulatory Visit (HOSPITAL_COMMUNITY): Payer: BC Managed Care – PPO

## 2020-02-25 DIAGNOSIS — Z01812 Encounter for preprocedural laboratory examination: Secondary | ICD-10-CM | POA: Diagnosis not present

## 2020-02-25 DIAGNOSIS — Z20822 Contact with and (suspected) exposure to covid-19: Secondary | ICD-10-CM | POA: Insufficient documentation

## 2020-02-25 LAB — SARS CORONAVIRUS 2 (TAT 6-24 HRS): SARS Coronavirus 2: NEGATIVE

## 2020-02-27 ENCOUNTER — Other Ambulatory Visit: Payer: Self-pay

## 2020-02-27 ENCOUNTER — Ambulatory Visit (HOSPITAL_COMMUNITY)
Admission: RE | Admit: 2020-02-27 | Discharge: 2020-02-27 | Disposition: A | Discharge status: Home / Self Care | Payer: BC Managed Care – PPO | Source: Ambulatory Visit | Attending: Emergency Medicine | Admitting: Emergency Medicine

## 2020-02-27 DIAGNOSIS — J45909 Unspecified asthma, uncomplicated: Secondary | ICD-10-CM | POA: Insufficient documentation

## 2020-02-27 LAB — PULMONARY FUNCTION TEST
FEF 25-75 Post: 2.63 L/sec
FEF 25-75 Pre: 2.57 L/sec
FEF2575-%Change-Post: 2 %
FEF2575-%Pred-Post: 95 %
FEF2575-%Pred-Pre: 93 %
FEV1-%Change-Post: -1 %
FEV1-%Pred-Post: 93 %
FEV1-%Pred-Pre: 94 %
FEV1-Post: 2.46 L
FEV1-Pre: 2.49 L
FEV1FVC-%Change-Post: 3 %
FEV1FVC-%Pred-Pre: 100 %
FEV6-%Change-Post: -4 %
FEV6-%Pred-Post: 89 %
FEV6-%Pred-Pre: 94 %
FEV6-Post: 2.88 L
FEV6-Pre: 3.02 L
FEV6FVC-%Change-Post: 0 %
FEV6FVC-%Pred-Post: 102 %
FEV6FVC-%Pred-Pre: 102 %
FVC-%Change-Post: -4 %
FVC-%Pred-Post: 87 %
FVC-%Pred-Pre: 92 %
FVC-Post: 2.89 L
FVC-Pre: 3.02 L
Post FEV1/FVC ratio: 85 %
Post FEV6/FVC ratio: 100 %
Pre FEV1/FVC ratio: 82 %
Pre FEV6/FVC Ratio: 100 %

## 2020-02-27 MED ORDER — SODIUM CHLORIDE 0.9 % IN NEBU
3.0000 mL | INHALATION_SOLUTION | Freq: Once | RESPIRATORY_TRACT | Status: DC
  Filled 2020-02-27: qty 3

## 2020-02-27 MED ORDER — METHACHOLINE 4 MG/ML NEB SOLN
2.0000 mL | Freq: Once | RESPIRATORY_TRACT | Status: DC
  Filled 2020-02-27: qty 2

## 2020-02-27 MED ORDER — METHACHOLINE 0.25 MG/ML NEB SOLN
3.0000 mL | Freq: Once | RESPIRATORY_TRACT | Status: AC
  Administered 2020-02-27: 0.75 mg via RESPIRATORY_TRACT
  Filled 2020-02-27: qty 4

## 2020-02-27 MED ORDER — METHACHOLINE 0.0625 MG/ML NEB SOLN
3.0000 mL | Freq: Once | RESPIRATORY_TRACT | Status: AC
  Administered 2020-02-27: 0.1875 mg via RESPIRATORY_TRACT
  Filled 2020-02-27: qty 3

## 2020-02-27 MED ORDER — METHACHOLINE 1 MG/ML NEB SOLN
3.0000 mL | Freq: Once | RESPIRATORY_TRACT | Status: AC
  Administered 2020-02-27: 3 mg via RESPIRATORY_TRACT
  Filled 2020-02-27: qty 4

## 2020-02-27 MED ORDER — SODIUM CHLORIDE 0.9 % IN NEBU
3.0000 mL | INHALATION_SOLUTION | Freq: Once | RESPIRATORY_TRACT | Status: AC
  Administered 2020-02-27: 3 mL via RESPIRATORY_TRACT
  Filled 2020-02-27: qty 3

## 2020-02-27 MED ORDER — METHACHOLINE 1 MG/ML NEB SOLN
2.0000 mL | Freq: Once | RESPIRATORY_TRACT | Status: DC
  Filled 2020-02-27: qty 2

## 2020-02-27 MED ORDER — ALBUTEROL SULFATE (2.5 MG/3ML) 0.083% IN NEBU
2.5000 mg | INHALATION_SOLUTION | Freq: Once | RESPIRATORY_TRACT | Status: DC
  Administered 2020-02-27: 2.5 mg via RESPIRATORY_TRACT

## 2020-02-27 MED ORDER — METHACHOLINE 16 MG/ML NEB SOLN
3.0000 mL | Freq: Once | RESPIRATORY_TRACT | Status: DC
  Filled 2020-02-27: qty 4

## 2020-02-27 MED ORDER — METHACHOLINE 0.25 MG/ML NEB SOLN
2.0000 mL | Freq: Once | RESPIRATORY_TRACT | Status: DC
  Filled 2020-02-27: qty 2

## 2020-02-27 MED ORDER — METHACHOLINE 4 MG/ML NEB SOLN
3.0000 mL | Freq: Once | RESPIRATORY_TRACT | Status: DC
  Filled 2020-02-27: qty 4

## 2020-02-27 MED ORDER — METHACHOLINE 16 MG/ML NEB SOLN
2.0000 mL | Freq: Once | RESPIRATORY_TRACT | Status: DC
  Filled 2020-02-27: qty 2

## 2020-02-27 MED ORDER — ALBUTEROL SULFATE (2.5 MG/3ML) 0.083% IN NEBU: 2.5000 mg | INHALATION_SOLUTION | Freq: Once | RESPIRATORY_TRACT | Status: DC

## 2020-02-27 MED ORDER — METHACHOLINE 0.0625 MG/ML NEB SOLN
2.0000 mL | Freq: Once | RESPIRATORY_TRACT | Status: DC
  Filled 2020-02-27: qty 3

## 2020-04-30 ENCOUNTER — Other Ambulatory Visit: Payer: Self-pay | Admitting: Interventional Radiology

## 2020-04-30 DIAGNOSIS — D250 Submucous leiomyoma of uterus: Secondary | ICD-10-CM

## 2020-05-05 ENCOUNTER — Other Ambulatory Visit: Payer: Self-pay

## 2020-05-05 ENCOUNTER — Ambulatory Visit
Admission: RE | Admit: 2020-05-05 | Discharge: 2020-05-05 | Disposition: A | Discharge status: Home / Self Care | Payer: BC Managed Care – PPO | Source: Ambulatory Visit | Attending: Interventional Radiology | Admitting: Interventional Radiology

## 2020-05-05 DIAGNOSIS — D250 Submucous leiomyoma of uterus: Secondary | ICD-10-CM

## 2020-05-05 HISTORY — PX: IR RADIOLOGIST EVAL & MGMT: IMG5224

## 2020-05-05 NOTE — Progress Notes (Signed)
Chief Complaint: Symptomatic Uterine Fibroids  Referring Physician(s): Dr. Ronny Bacon Cole,Tara(Ob-Gyn) Alvy Bimler (Hematology) Ninfa Linden (Surgery)   History of Present Illness: Terri Kirby a 50 y.o.(G0, P0)femalepresenting today to [place] clinic as a scheduled follow up, ~6 months status post treatment of symptomatic uterine fibroids with bilateral uterine artery embolization performed 11/14/19, with superior hypogastric nerve block.   She joins [place] today by virtual visit, and I confirmed her identify using 2 personal identifiers.   She tells me that regarding the complaint of symptomatic bleeding, this has stopped completely.  She tells me that in fact she is noticing that she has not yet had a "regular period" after our treatment.  She notices occasional dark discharge, without foul odor or concern for infection.  She also has occasional cramping which she describes "as if my cycle is starting".  The contour of her lower abdomen has softened since our treatment, but persists.  Overall she is pleased so far.    Past Medical History:  Diagnosis Date  . Anemia   . Complication of anesthesia   . Iron deficiency anemia 11/13/2015  . PONV (postoperative nausea and vomiting)     Past Surgical History:  Procedure Laterality Date  . BREAST LUMPECTOMY WITH RADIOACTIVE SEED LOCALIZATION Right 12/12/2019   Procedure: RIGHT BREAST LUMPECTOMY WITH RADIOACTIVE SEED LOCALIZATION;  Surgeon: Coralie Keens, MD;  Location: [facility];  Service: General;  Laterality: Right;  . IR ANGIOGRAM PELVIS SELECTIVE OR SUPRASELECTIVE  11/14/2019  . IR ANGIOGRAM SELECTIVE EACH ADDITIONAL VESSEL  11/14/2019  . IR ANGIOGRAM SELECTIVE EACH ADDITIONAL VESSEL  11/14/2019  . IR EMBO TUMOR ORGAN ISCHEMIA INFARCT INC GUIDE ROADMAPPING  11/14/2019  . IR FLUORO GUIDED NEEDLE PLC ASPIRATION/INJECTION LOC  11/14/2019  . IR RADIOLOGIST EVAL & MGMT  10/03/2019  . IR RADIOLOGIST EVAL & MGMT   10/22/2019  . IR RADIOLOGIST EVAL & MGMT  12/04/2019  . IR US GUIDE VASC ACCESS RIGHT  11/14/2019  . SKIN BIOPSY      Allergies: Patient has no known allergies.  Medications: Prior to Admission medications   Medication Sig Start Date End Date Taking? Authorizing Provider  acetaminophen (TYLENOL) 500 MG tablet Take 500 mg by mouth every 6 (six) hours as needed for headache.    [provider]  albuterol (PROVENTIL HFA;VENTOLIN HFA) 108 (90 Base) MCG/ACT inhaler Inhale 1 puff into the lungs every 6 (six) hours as needed for wheezing or shortness of breath.    [provider]  benzonatate (TESSALON) 100 MG capsule Take 1 capsule (100 mg total) by mouth every 6 (six) hours as needed for cough. 01/30/20   Collene Gobble, MD  HYDROcodone-acetaminophen (NORCO) 5-325 MG tablet Take 1 tablet by mouth every 6 (six) hours as needed for moderate pain or severe pain. 12/12/19   Coralie Keens, MD  pantoprazole (PROTONIX) 40 MG tablet Take 1 tablet (40 mg total) by mouth daily. 01/30/20   Collene Gobble, MD  SYMBICORT 160-4.5 MCG/ACT inhaler Inhale 1 puff into the lungs 2 (two) times daily. 07/24/19   [provider]     No family history on file.  Social History   Socioeconomic History  . Marital status: Single    Spouse name: Not on file  . Number of children: 0  . Years of education: Not on file  . Highest education level: Not on file  Occupational History  . Occupation: Technical brewer: [employer]  Tobacco Use  .  Smoking status: Never Smoker  . Smokeless tobacco: Never Used  Substance and Sexual Activity  . Alcohol use: Yes    Comment: socially  . Drug use: Never  . Sexual activity: Not on file  Other Topics Concern  . Not on file  Social History Narrative   Vegetarian since 77   Social Determinants of Health   Financial Resource Strain: Not on file  Food Insecurity: Not on file  Transportation Needs: Not on file  Physical  Activity: Not on file  Stress: Not on file  Social Connections: Not on file      Review of Systems  Review of Systems: A 12 point ROS discussed and pertinent positives are indicated in the HPI above.  All other systems are negative.  Physical Exam No direct physical exam was performed (except for noted visual exam findings with Video Visits).    Vital Signs: There were no vitals taken for this visit.  Imaging: No results found.  Labs:  CBC: Recent Labs    09/13/19 1347 10/21/19 1033 11/14/19 1246 01/02/20 1228  WBC 8.5 6.5 8.4 6.7  HGB 9.5* 11.6* 12.9 12.7  HCT 32.5* 36.3 40.0 38.3  PLT 374 296 283 335    COAGS: Recent Labs    11/14/19 1246  INR 1.0    BMP: Recent Labs    11/14/19 1246  NA 138  K 4.0  CL 105  CO2 24  GLUCOSE 85  BUN 9  CALCIUM 9.0  CREATININE 1.04*  GFRNONAA >60  GFRAA >60    LIVER FUNCTION TESTS: No results for input(s): BILITOT, AST, ALT, ALKPHOS, PROT, ALBUMIN in the last 8760 hours.  TUMOR MARKERS: No results for input(s): AFPTM, CEA, CA199, CHROMGRNA in the last 8760 hours.  Assessment and Plan:  Terri Kirby is a 50 yo female SP treatment of symptomatic uterine fibroids with bilateral uterine artery embolization, performed 11/14/19.   She is about 6 months now from treatment, and has seen a significant reduction in the degree of her dysfunctional bleeding, which has essentially stopped.  She has occasional ongoing cramping, and continues to have a slight contour abnormality of the lower abdomen, but is softer.    We discussed her progression so far as expected, and that it takes longer for bulk symptoms to be optimized.  As long as she does not have any concern for infection, some discharge is normal/expected.    We will see her back in 6 months with another appointment. She knows that she can call us back in the interval if there are any concerns.   Plan: - 6 month follow up appointment   Electronically Signed: Corrie Mckusick 05/05/2020, 4:17 PM   I spent a total of    25 Minutes in remote  clinical consultation, greater than 50% of which was counseling/coordinating care for SP bilateral uterine artery embolization.    Visit type: Audio only (telephone). Audio (no video) only due to patient's lack of internet/smartphone capability. Alternative for in-person consultation at [facility], [address], [place], [place]. This visit type was conducted due to national recommendations for restrictions regarding the COVID-19 Pandemic (e.g. social distancing).  This format is felt to be most appropriate for this patient at this time.  All issues noted in this document were discussed and addressed.

## 2020-10-05 ENCOUNTER — Other Ambulatory Visit: Payer: Self-pay | Admitting: Interventional Radiology

## 2020-10-05 DIAGNOSIS — D250 Submucous leiomyoma of uterus: Secondary | ICD-10-CM

## 2020-10-27 ENCOUNTER — Other Ambulatory Visit: Payer: Self-pay

## 2020-10-27 ENCOUNTER — Encounter: Payer: Self-pay | Admitting: *Deleted

## 2020-10-27 ENCOUNTER — Ambulatory Visit
Admission: RE | Admit: 2020-10-27 | Discharge: 2020-10-27 | Disposition: A | Discharge status: Home / Self Care | Payer: BC Managed Care – PPO | Source: Ambulatory Visit | Attending: Interventional Radiology | Admitting: Interventional Radiology

## 2020-10-27 DIAGNOSIS — D250 Submucous leiomyoma of uterus: Secondary | ICD-10-CM

## 2020-10-27 HISTORY — PX: IR RADIOLOGIST EVAL & MGMT: IMG5224

## 2020-10-27 NOTE — Progress Notes (Signed)
Chief Complaint: Symptomatic Uterine Fibroids     Referring Physician(s): Dr. Herma Carson (Ob-Gyn) Alvy Bimler (Hematology) Ninfa Linden (Surgery)     History of Present Illness: Terri Kirby is a 50 y.o.(G0, P0) female presenting today to [place] clinic as a scheduled follow up, ~12 months status post UFE.       She joins [place] today by virtual visit, and I confirmed her identify using 2 personal identifiers.   Hx:  We first met her 10/03/2019 in clinic, with my partner Dr Pascal Lux.  Her main complaint regarding the fibroids was menorrhagia.  We performed treatment of symptomatic uterine fibroids with bilateral uterine artery embolization performed 11/14/19, with superior hypogastric nerve block. We last saw her as follow up 05/05/20.   Interval:  Today she tells me that her periods have essentially ceased.  She reports that she does notice her cycles with some "crampy pain", but she only has some minor clear discharge that takes the place of her usual period.    She tells me she is very satisfied with her outcome.    She will continue to see her OB/GYN.    Past Medical History:  Diagnosis Date   Anemia    Complication of anesthesia    Iron deficiency anemia 11/13/2015   PONV (postoperative nausea and vomiting)     Past Surgical History:  Procedure Laterality Date   BREAST LUMPECTOMY WITH RADIOACTIVE SEED LOCALIZATION Right 12/12/2019   Procedure: RIGHT BREAST LUMPECTOMY WITH RADIOACTIVE SEED LOCALIZATION;  Surgeon: Coralie Keens, MD;  Location: [facility];  Service: General;  Laterality: Right;   IR ANGIOGRAM PELVIS SELECTIVE OR SUPRASELECTIVE  11/14/2019   IR ANGIOGRAM SELECTIVE EACH ADDITIONAL VESSEL  11/14/2019   IR ANGIOGRAM SELECTIVE EACH ADDITIONAL VESSEL  11/14/2019   IR EMBO TUMOR ORGAN ISCHEMIA INFARCT INC GUIDE ROADMAPPING  11/14/2019   IR FLUORO GUIDED NEEDLE PLC ASPIRATION/INJECTION LOC  11/14/2019   IR RADIOLOGIST EVAL & MGMT  10/03/2019   IR  RADIOLOGIST EVAL & MGMT  10/22/2019   IR RADIOLOGIST EVAL & MGMT  12/04/2019   IR RADIOLOGIST EVAL & MGMT  05/05/2020   IR US GUIDE VASC ACCESS RIGHT  11/14/2019   SKIN BIOPSY      Allergies: Patient has no known allergies.  Medications: Prior to Admission medications   Medication Sig Start Date End Date Taking? Authorizing Provider  acetaminophen (TYLENOL) 500 MG tablet Take 500 mg by mouth every 6 (six) hours as needed for headache.    [provider]  albuterol (PROVENTIL HFA;VENTOLIN HFA) 108 (90 Base) MCG/ACT inhaler Inhale 1 puff into the lungs every 6 (six) hours as needed for wheezing or shortness of breath.    [provider]  benzonatate (TESSALON) 100 MG capsule Take 1 capsule (100 mg total) by mouth every 6 (six) hours as needed for cough. 01/30/20   Collene Gobble, MD  HYDROcodone-acetaminophen (NORCO) 5-325 MG tablet Take 1 tablet by mouth every 6 (six) hours as needed for moderate pain or severe pain. 12/12/19   Coralie Keens, MD  pantoprazole (PROTONIX) 40 MG tablet Take 1 tablet (40 mg total) by mouth daily. 01/30/20   Collene Gobble, MD  SYMBICORT 160-4.5 MCG/ACT inhaler Inhale 1 puff into the lungs 2 (two) times daily. 07/24/19   [provider]     No family history on file.  Social History   Socioeconomic History   Marital status: Single    Spouse name: Not on file   Number  of children: 0   Years of education: Not on file   Highest education level: Not on file  Occupational History   Occupation: Technical brewer: [employer]  Tobacco Use   Smoking status: Never   Smokeless tobacco: Never  Substance and Sexual Activity   Alcohol use: Yes    Comment: socially   Drug use: Never   Sexual activity: Not on file  Other Topics Concern   Not on file  Social History Narrative   Vegetarian since 65   Social Determinants of Health   Financial Resource Strain: Not on file  Food Insecurity: Not on file   Transportation Needs: Not on file  Physical Activity: Not on file  Stress: Not on file  Social Connections: Not on file       Review of Systems  Review of Systems: A 12 point ROS discussed and pertinent positives are indicated in the HPI above.  All other systems are negative.  Physical Exam No direct physical exam was performed (except for noted visual exam findings with Video Visits).   Vital Signs: There were no vitals taken for this visit.  Imaging: No results found.  Labs:  CBC: Recent Labs    11/14/19 1246 01/02/20 1228  WBC 8.4 6.7  HGB 12.9 12.7  HCT 40.0 38.3  PLT 283 335    COAGS: Recent Labs    11/14/19 1246  INR 1.0    BMP: Recent Labs    11/14/19 1246  NA 138  K 4.0  CL 105  CO2 24  GLUCOSE 85  BUN 9  CALCIUM 9.0  CREATININE 1.04*  GFRNONAA >60  GFRAA >60    LIVER FUNCTION TESTS: No results for input(s): BILITOT, AST, ALT, ALKPHOS, PROT, ALBUMIN in the last 8760 hours.  TUMOR MARKERS: No results for input(s): AFPTM, CEA, CA199, CHROMGRNA in the last 8760 hours.  Assessment and Plan:   [name] is a 50 yo female SP treatment of symptomatic uterine fibroids with bilateral uterine artery embolization, performed 11/14/19.   She is about 12 months now from treatment, and she is very satisfied with her result after UFE, having complete cessation of dysfunctional uterine bleeding. She has cramping at the time of her monthly cycle, with minor clear discharge replacing her periods, but is overall happy with improvement.     I did let her know that sometimes there can be some recovery of blood flow in that she might experience more "normal" periods in the future until menopause.     At this point we would not schedule a formal follow up.  She knows that she can call us back in the interval if there are any concerns in the future.      Electronically Signed: Corrie Mckusick 10/27/2020, 9:50 AM   I spent a total of    15 Minutes in remote   clinical consultation, greater than 50% of which was counseling/coordinating care for prior UFE for symptomatic fibroids.    Visit type: Audio only (telephone). Audio (no video) only due to patient's lack of internet/smartphone capability. Alternative for in-person consultation at [facility], [address] [place], [place], [place]. This visit type was conducted due to national recommendations for restrictions regarding the COVID-19 Pandemic (e.g. social distancing).  This format is felt to be most appropriate for this patient at this time.  All issues noted in this document were discussed and addressed.

## 2021-05-05 ENCOUNTER — Encounter (HOSPITAL_COMMUNITY): Payer: Self-pay

## 2021-09-30 IMAGING — MG MM BREAST LOCALIZATION CLIP
4 series · 4 of 12 positions shown · non-contrast
Comparison: Previous exam(s).

CLINICAL DATA: Evaluate post biopsy marker clip placement following
ultrasound-guided core needle biopsy of the right breast.

EXAM:
DIAGNOSTIC RIGHT MAMMOGRAM POST ULTRASOUND BIOPSY

[R CC synth-2D]
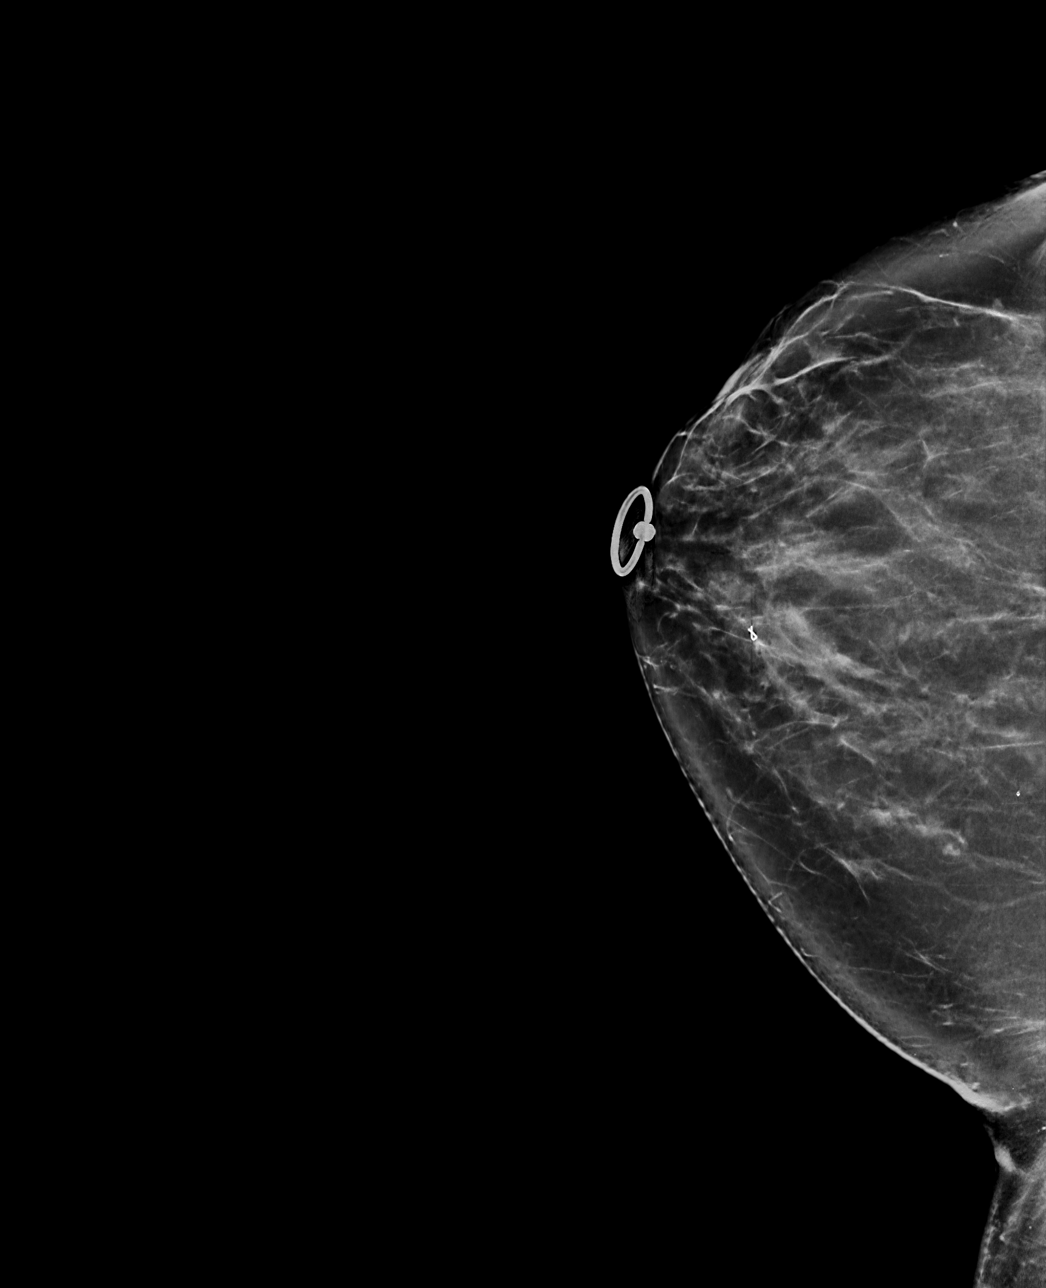

[R ML synth-2D]
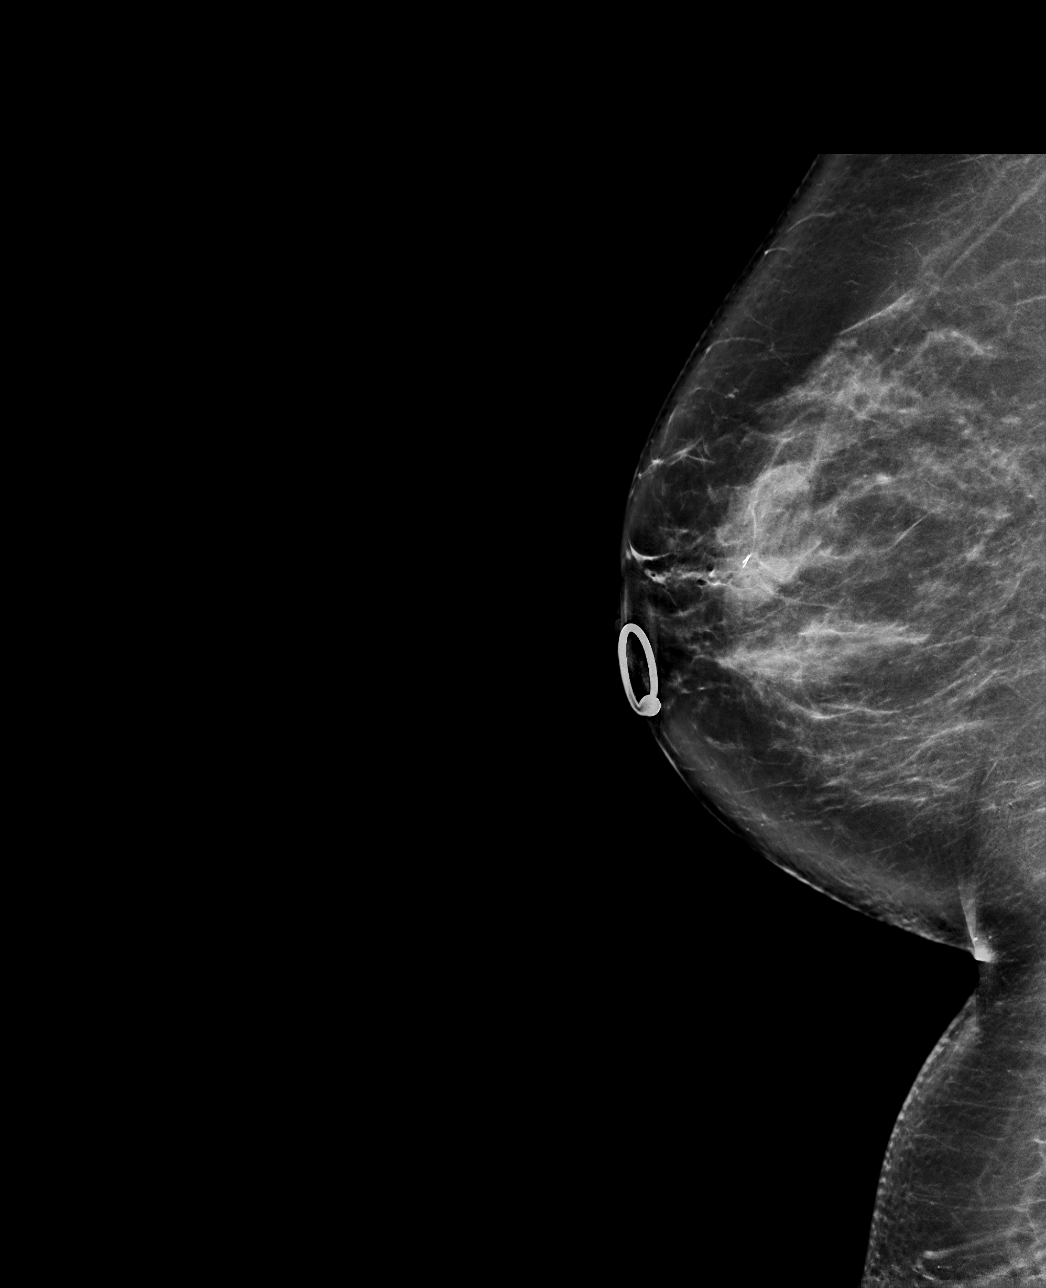

[R ML tomo · tomo slice 49/97.0]
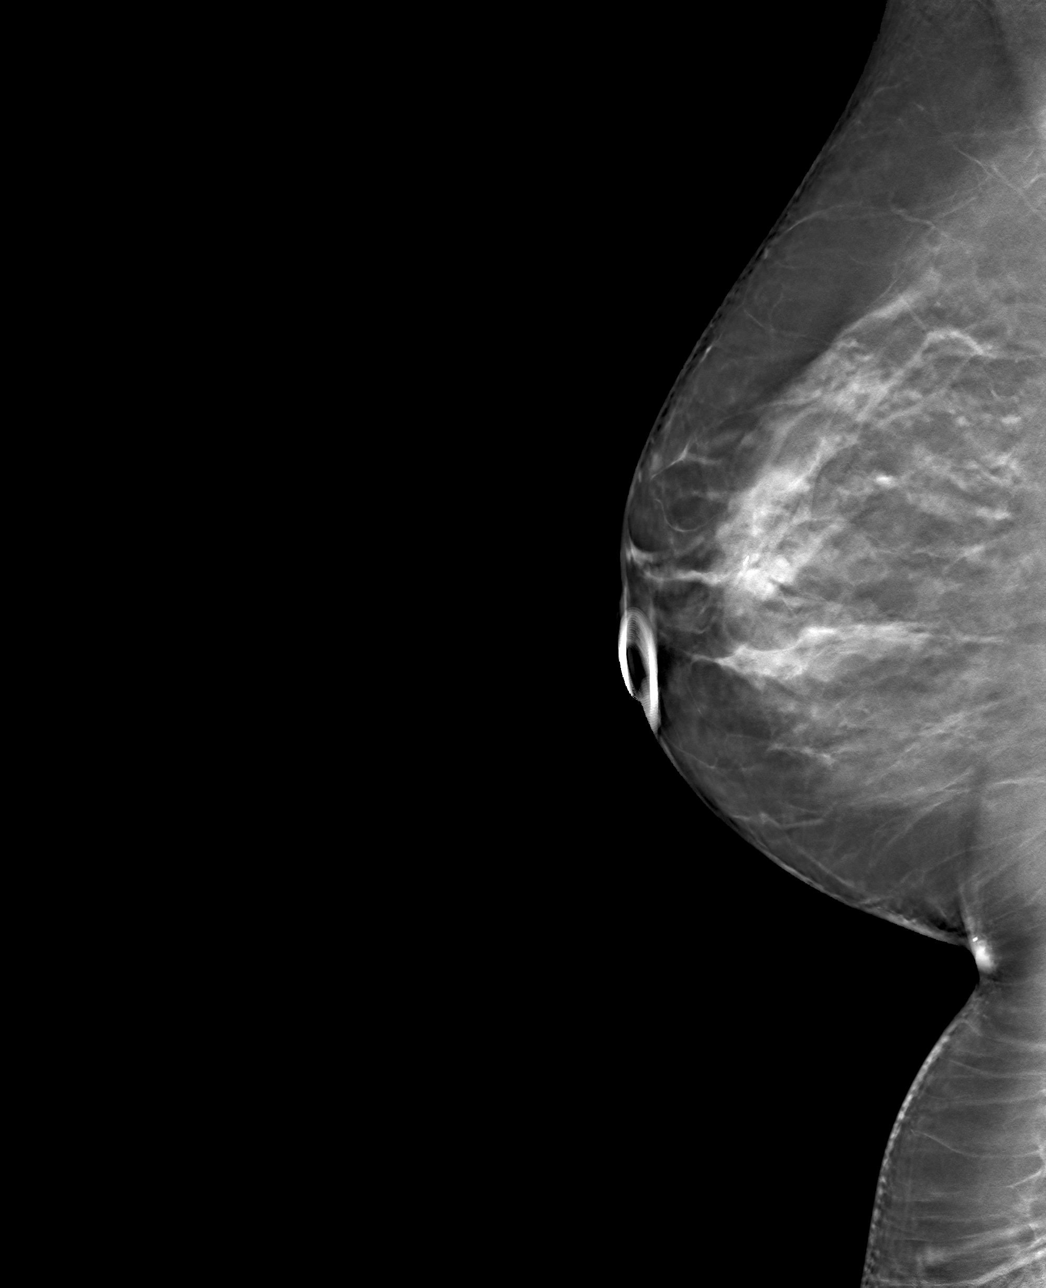

[R CC tomo · tomo slice 47/93.0]
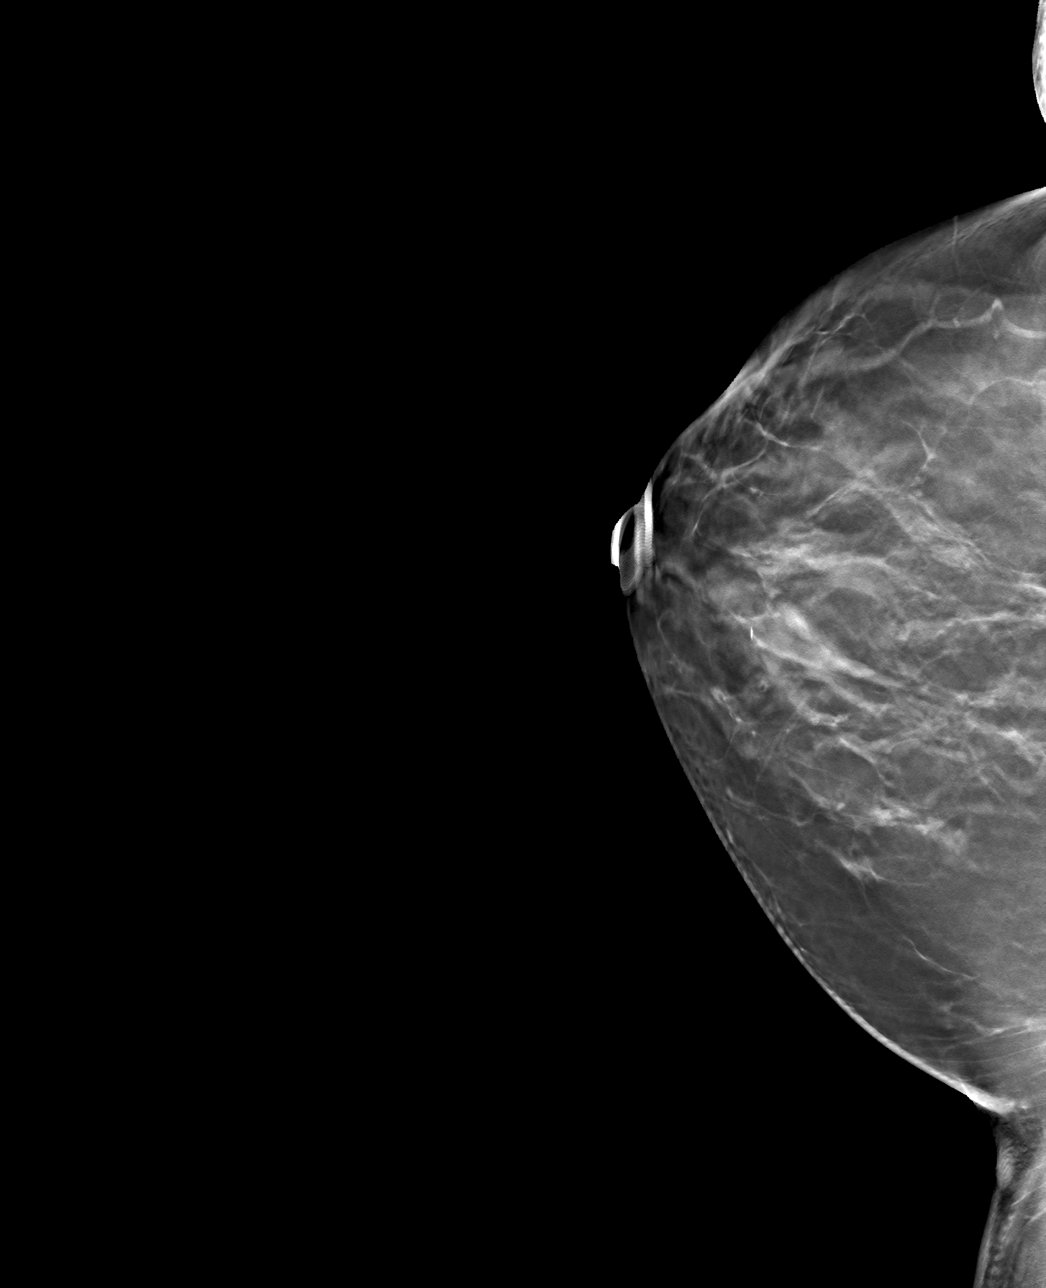

[4 of 12 positions shown; findings below may reference images not displayed]

FINDINGS: Mammographic images were obtained following ultrasound guided biopsy
of an intraductal mass at the 12:30 o'clock, retroareolar position
of the right breast. The biopsy marking clip is in expected position
at the site of biopsy.
IMPRESSION: Appropriate positioning of the ribbon shaped biopsy marking clip at
the site of biopsy in the upper outer retroareolar right breast.

Final Assessment: Post Procedure Mammograms for Marker Placement

## 2021-12-03 IMAGING — DX DG CHEST 2V
2 series · 2 of 2 positions shown · non-contrast
Comparison: None.

CLINICAL DATA: Asthma.  Shortness of breath and weakness.

EXAM:
CHEST - 2 VIEW

[chest pa]
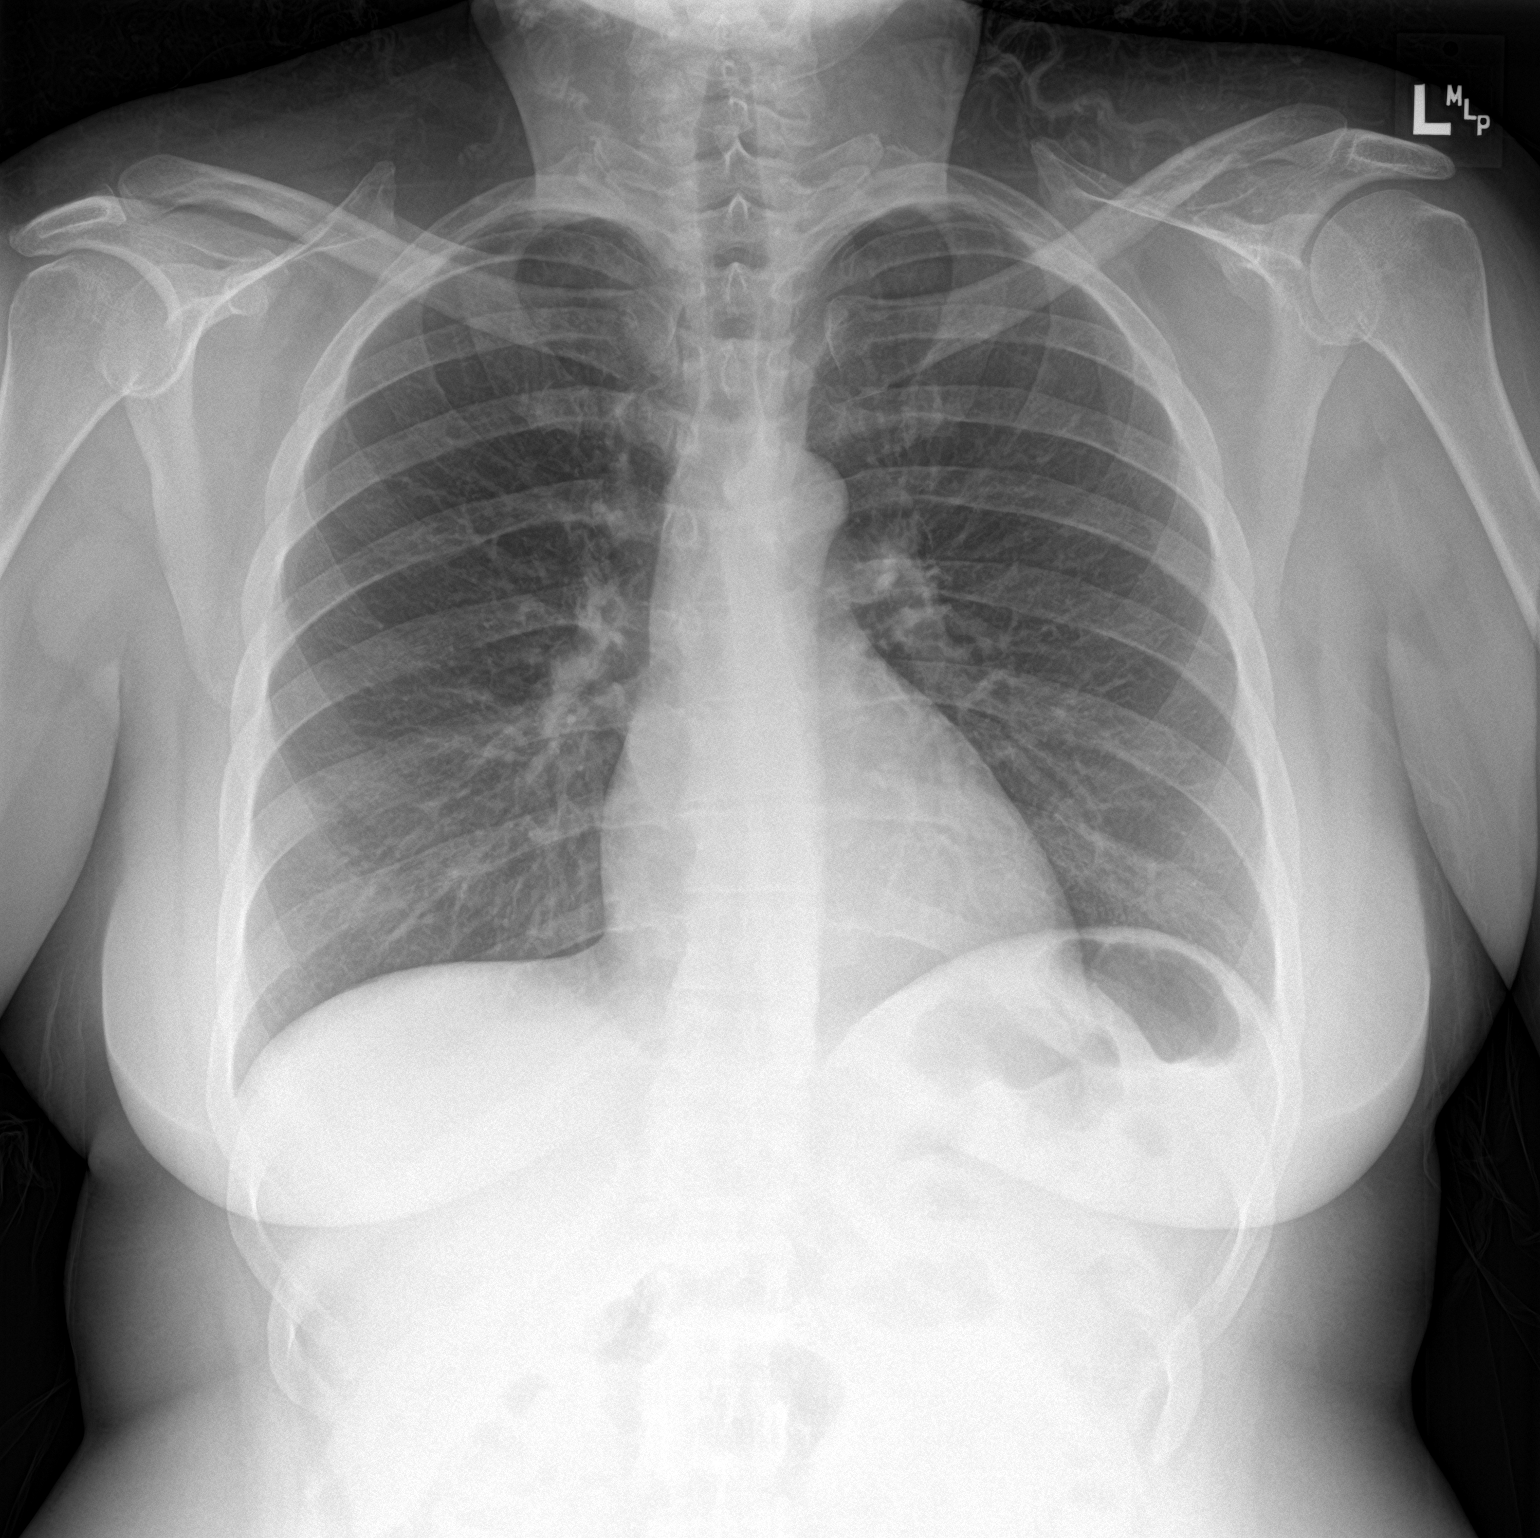

[chest lat]
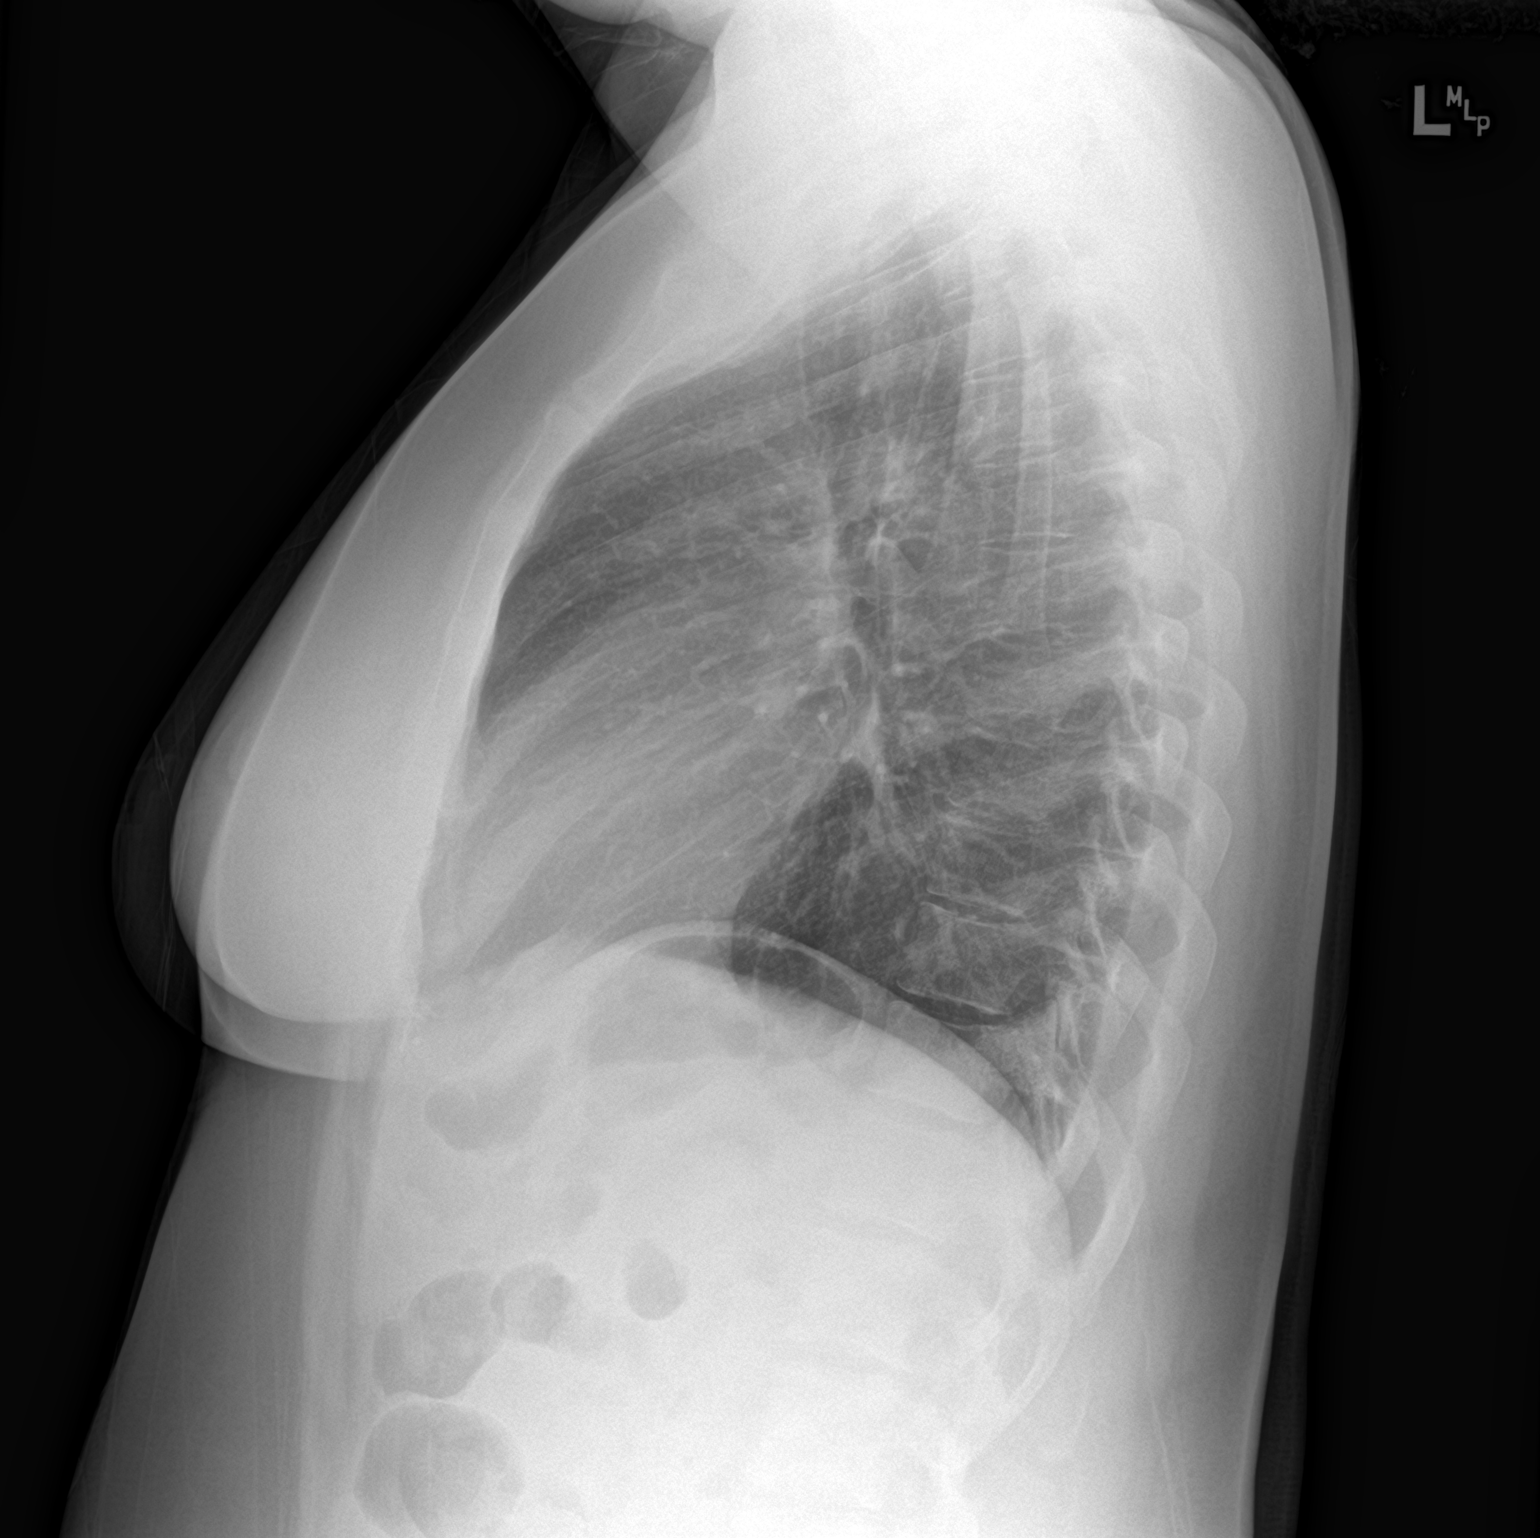

[2 of 2 positions shown; findings below may reference images not displayed]

FINDINGS: Normal sized heart. Clear lungs with normal vascularity. Minimal
scoliosis.
IMPRESSION: No acute abnormality.

## 2023-05-24 DIAGNOSIS — R42 Dizziness and giddiness: Secondary | ICD-10-CM | POA: Diagnosis not present

## 2023-05-24 DIAGNOSIS — A084 Viral intestinal infection, unspecified: Secondary | ICD-10-CM | POA: Diagnosis not present
